# Patient Record
Sex: Female | Born: 1960 | Race: White | Hispanic: No | Marital: Married | Smoking: Never smoker
Health system: Southern US, Community
[De-identification: ages and names within clinical notes are randomized; demographics above are authoritative.]

## PROBLEM LIST (undated history)

## (undated) DIAGNOSIS — F32A Depression, unspecified: Secondary | ICD-10-CM

## (undated) DIAGNOSIS — M1990 Unspecified osteoarthritis, unspecified site: Secondary | ICD-10-CM

## (undated) DIAGNOSIS — Z889 Allergy status to unspecified drugs, medicaments and biological substances status: Secondary | ICD-10-CM

## (undated) DIAGNOSIS — K219 Gastro-esophageal reflux disease without esophagitis: Secondary | ICD-10-CM

## (undated) DIAGNOSIS — L309 Dermatitis, unspecified: Secondary | ICD-10-CM

## (undated) DIAGNOSIS — F329 Major depressive disorder, single episode, unspecified: Secondary | ICD-10-CM

## (undated) DIAGNOSIS — F419 Anxiety disorder, unspecified: Secondary | ICD-10-CM

## (undated) HISTORY — DX: Major depressive disorder, single episode, unspecified: F32.9

## (undated) HISTORY — DX: Depression, unspecified: F32.A

## (undated) HISTORY — PX: COLONOSCOPY: SHX174

## (undated) HISTORY — DX: Gastro-esophageal reflux disease without esophagitis: K21.9

## (undated) HISTORY — PX: REFRACTIVE SURGERY: SHX103

## (undated) HISTORY — DX: Anxiety disorder, unspecified: F41.9

## (undated) HISTORY — DX: Dermatitis, unspecified: L30.9

## (undated) HISTORY — PX: EYE SURGERY: SHX253

---

## 1994-03-18 HISTORY — PX: BREAST EXCISIONAL BIOPSY: SUR124

## 1994-03-18 HISTORY — PX: BREAST BIOPSY: SHX20

## 2015-10-17 ENCOUNTER — Ambulatory Visit: Payer: Self-pay | Admitting: Family Medicine

## 2015-11-16 ENCOUNTER — Ambulatory Visit (INDEPENDENT_AMBULATORY_CARE_PROVIDER_SITE_OTHER): Payer: Self-pay | Admitting: Family Medicine

## 2015-11-16 ENCOUNTER — Encounter: Payer: Self-pay | Admitting: Family Medicine

## 2015-11-16 VITALS — BP 124/84 | HR 87 | Temp 98.2°F | Resp 12 | Ht 68.0 in | Wt 190.8 lb

## 2015-11-16 DIAGNOSIS — L301 Dyshidrosis [pompholyx]: Secondary | ICD-10-CM

## 2015-11-16 DIAGNOSIS — K219 Gastro-esophageal reflux disease without esophagitis: Secondary | ICD-10-CM | POA: Insufficient documentation

## 2015-11-16 DIAGNOSIS — F325 Major depressive disorder, single episode, in full remission: Secondary | ICD-10-CM

## 2015-11-16 DIAGNOSIS — M79672 Pain in left foot: Secondary | ICD-10-CM

## 2015-11-16 DIAGNOSIS — M79671 Pain in right foot: Secondary | ICD-10-CM

## 2015-11-16 DIAGNOSIS — L738 Other specified follicular disorders: Secondary | ICD-10-CM

## 2015-11-16 DIAGNOSIS — F419 Anxiety disorder, unspecified: Secondary | ICD-10-CM

## 2015-11-16 MED ORDER — CLOBETASOL PROPIONATE 0.05 % EX CREA: 1.0000 "application " | TOPICAL_CREAM | Freq: Two times a day (BID) | CUTANEOUS | 0 refills | Status: DC

## 2015-11-16 MED ORDER — ALPRAZOLAM 0.5 MG PO TABS: ORAL_TABLET | ORAL | 0 refills | Status: DC

## 2015-11-16 MED ORDER — FLUOXETINE HCL 20 MG PO TABS: 20.0000 mg | ORAL_TABLET | Freq: Every day | ORAL | 3 refills | Status: DC

## 2015-11-16 MED ORDER — OMEPRAZOLE 20 MG PO CPDR: 20.0000 mg | DELAYED_RELEASE_CAPSULE | ORAL | 2 refills | Status: DC | PRN

## 2015-11-16 NOTE — Progress Notes (Signed)
HPI:   [name] is a 55 y.o. female, who is here today to establish care with me.  Former PCP: Dr Madeline Richmond in [place] Last preventive routine visit: 03/2015 she had her routine gyn preventive care, according to patient, she usually has cholesterol and diabetes screening done along with pap smear and mammogram. Colonoscopy reported at age 72.    Concerns today: Heel pain, hand rash, dermatologists referral,and medication refills.  -Foot pain: According to patient, for about 8-9 months she has had bilateral heel pain, she points at the posterior aspect of heels. Pain is mild, overall is stable. She has tried to change shoewear but it does not seem to help. Occasionally she takes OTC Advil. No local edema, erythema, or deformity. No history of trauma. Pain can be worse in the morning when she first gets up, it may get a "litle" better after a few steps but no always. It is also exacerbated by prolonged walking. This problem has been addressed previously by former PCP, she would like to see a foot doctor.  -She is also reporting history of eczema, but for the past few months she's been having pruritic hand rash, she has used prescription topical steroids  unsuccessfully, last one she used betamethasone cream. No new detergents, lotions, or medications. She also would like a referral to a dermatologist, she has noted some skin lesions on her face and neck that she would like removed. No prior history of skin cancer.  -Hx of depression, she has been on Prozac 20 mg for over 10 years. She still feels like medication is helping, she denies any depressed mood or suicidal thoughts. She denies any history of psychiatric hospitalizations. She is also requesting a prescription for Xanax 1 mg, she takes half tablet as needed when she is going to fly, which causes anxiety. She denies any side effects from medication.   GERD: She is currently on omeprazole 20 mg, which now she is not  taking daily. Certain food exacerbates symptoms.  Denies abdominal pain, nausea, vomiting, changes in bowel habits, blood in stool or melena.  She lives with her husband, her daughter comes from college during breaks. She follows a healthy diet and exercises regularly.   Review of Systems  Constitutional: Negative for activity change, appetite change, fatigue, fever and unexpected weight change.  HENT: Negative for mouth sores, nosebleeds and trouble swallowing.   Eyes: Negative for redness and visual disturbance.  Respiratory: Negative for cough, shortness of breath and wheezing.   Cardiovascular: Negative for chest pain, palpitations and leg swelling.  Gastrointestinal: Negative for abdominal pain, nausea and vomiting.       Negative for changes in bowel habits.  Genitourinary: Negative for decreased urine volume, difficulty urinating, dysuria and hematuria.  Musculoskeletal: Positive for arthralgias. Negative for gait problem, joint swelling and myalgias.  Skin: Positive for rash. Negative for wound.  Neurological: Negative for seizures, syncope, weakness, numbness and headaches.  Psychiatric/Behavioral: Negative for confusion and sleep disturbance. The patient is nervous/anxious.       No current outpatient prescriptions on file prior to visit.   No current facility-administered medications on file prior to visit.      Past Medical History:  Diagnosis Date  . Anxiety   . Depression   . Eczema   . GERD (gastroesophageal reflux disease)    Allergies  Allergen Reactions  . Codeine Camsylate [Codeine]     Family History  Problem Relation Age of Onset  . Arthritis Mother   .  Cancer Father   . Drug abuse Father   . Hypertension Father   . Cancer Sister     Social History   Social History  . Marital status: Married    Spouse name: N/A  . Number of children: N/A  . Years of education: N/A   Social History Main Topics  . Smoking status: Never Smoker  .  Smokeless tobacco: Never Used  . Alcohol use 4.2 oz/week    7 Glasses of wine per week  . Drug use: No  . Sexual activity: Yes    Birth control/ protection: IUD   Other Topics Concern  . None   Social History Narrative  . None    Vitals:   11/16/15 0900  BP: 124/84  Pulse: 87  Resp: 12  Temp: 98.2 F (36.8 C)   O2 sat 97% at RA.  Body mass index is 29.01 kg/m.      Physical Exam  Nursing note and vitals reviewed. Constitutional: She is oriented to person, place, and time. She appears well-developed. No distress.  HENT:  Head: Atraumatic.  Mouth/Throat: Oropharynx is clear and moist and mucous membranes are normal.  Eyes: Conjunctivae and EOM are normal. Pupils are equal, round, and reactive to light.  Neck: No thyroid mass and no thyromegaly present.  Cardiovascular: Normal rate and regular rhythm.   No murmur heard. Pulses:      Dorsalis pedis pulses are 2+ on the right side, and 2+ on the left side.  Respiratory: Effort normal and breath sounds normal. No respiratory distress.  GI: Soft. She exhibits no mass. There is no hepatomegaly. There is no tenderness.  Musculoskeletal: She exhibits no edema.       Right ankle: She exhibits normal range of motion.       Left ankle: She exhibits normal range of motion.       Right foot: There is normal range of motion and no deformity.       Left foot: There is normal range of motion and no deformity.       Feet:  Mild tenderness upon palpation of right posterior aspect of heel, Achilles tendon insertion. Normal ankle flexion and extension, it doesn't elicit pain. No signs of synovitis.  Lymphadenopathy:    She has no cervical adenopathy.  Neurological: She is alert and oriented to person, place, and time. She has normal strength. Coordination normal.  Skin: Skin is warm. Rash noted. Rash is maculopapular. No erythema.     Scattered papular (1-2 mm), whitish lesions on mandibular area (R) and upper neck. Fine/scaly,  erythematous rash noted on the palmar aspect right hand, some lesions also on medial and lateral aspect of IP joints and distal fingers.   Psychiatric: She has a normal mood and affect. Her speech is normal.  Well groomed, good eye contact.      ASSESSMENT AND PLAN:     Madeline Richmond was seen today for new patient (initial visit).  Diagnoses and all orders for this visit:  Gastroesophageal reflux disease without esophagitis  Stable. GERD precautions to continue. No changes in current management, some side effects of PPI discussed. F/U in 12 months.  -     omeprazole (PRILOSEC) 20 MG capsule; Take 1 capsule (20 mg total) by mouth as needed.  Heel pain, bilateral  ? Achilles tendinitis. Chronic, I explained that I'll treat refill is not necessary with most of her insurance, she will let me know if it is needed. OTC NSAIDs currently taking as  needed for pain.  Eczema, dyshidrotic  Topical steroid, Clobetasol, recommended. Recommend applying small amount twice per day as needed. Also recommended wearing gloves with cotton lining to prevent irritation.  -     clobetasol cream (TEMOVATE) 0.05 %; Apply 1 application topically 2 (two) times daily. 14 days  Depression, major, in remission ([place])  Stable. She is not interested in trying to wean off medication, so no changes in current management. F/U in 12 months, before if needed.  -     FLUoxetine (PROZAC) 20 MG tablet; Take 1 tablet (20 mg total) by mouth daily.  Anxiety disorder, unspecified  A Rx for Xanax 0.5 mg # 30/0 given today to take as needed. Explained that if she doesn't need a refill she can follow up annually.  -     ALPRAZolam (XANAX) 0.5 MG tablet; 1 tab as needed when flying  Sebaceous hyperplasia of face  Reassured. Referral to dermatologist was placed.   -     Ambulatory referral to Dermatology    -She will continue following with her gynecologist. -I will see you annually, before if  needed.        Betty G. [place], MD  [facility]. [place] office.

## 2015-11-16 NOTE — Progress Notes (Signed)
Pre visit review using our clinic review tool, if applicable. No additional management support is needed unless otherwise documented below in the visit note. 

## 2015-11-16 NOTE — Patient Instructions (Addendum)
A few things to remember from today's visit:   Gastroesophageal reflux disease without esophagitis  Heel pain, bilateral  Eczema, dyshidrotic  Depression, major, in remission ([place])  Anxiety disorder, unspecified  Sebaceous hyperplasia of face - Plan: Ambulatory referral to Dermatology  If a prescription of Xanax last a year ago had to follow until next office visit in a year.  Ms.Keelee Roll, today we have followed on some of your chronic medical problems and they seem to be stable, so no changes in current management today.  Review medication list and be sure if is accurate.  -Remember a healthy diet and regular physical activity are very important for prevention as well as for well being; they also help with many chronic problems, decreasing the need of adding new medications and delaying or preventing possible complications.  Remember to arrange your follow up appt before leaving today. Please follow sooner than planned if a new concern arises.  For podiatry, foot doctor, referral is not needed, please let me know if this is not your case.     Avoid foods that make your symptoms worse, for example coffee, chocolate,pepermeint,alcohol, and greasy food. Raising the head of your bed about 6 inches may help with nocturnal symptoms.   Weight loss (if you are overweight). Avoid lying down for 3 hours after eating.  Instead 3 large meals daily try small and more frequent meals during the day.  Some medications we recommend for acid reflux treatment (proton pump inhibitors) can cause some problems in the long term: increase risk of  vitamin deficiencies,pneumonia, and more recently discovered that it can increase the risk of chronic kidney disease and cardiovascular disease.

## 2015-11-21 ENCOUNTER — Other Ambulatory Visit: Payer: Self-pay

## 2015-11-21 DIAGNOSIS — K219 Gastro-esophageal reflux disease without esophagitis: Secondary | ICD-10-CM

## 2015-11-21 MED ORDER — OMEPRAZOLE 20 MG PO CPDR: DELAYED_RELEASE_CAPSULE | ORAL | 2 refills | Status: DC

## 2016-10-31 DIAGNOSIS — M79672 Pain in left foot: Secondary | ICD-10-CM | POA: Diagnosis not present

## 2016-10-31 DIAGNOSIS — M23352 Other meniscus derangements, posterior horn of lateral meniscus, left knee: Secondary | ICD-10-CM | POA: Diagnosis not present

## 2016-10-31 DIAGNOSIS — M25572 Pain in left ankle and joints of left foot: Secondary | ICD-10-CM | POA: Diagnosis not present

## 2016-10-31 DIAGNOSIS — M7671 Peroneal tendinitis, right leg: Secondary | ICD-10-CM | POA: Diagnosis not present

## 2016-10-31 DIAGNOSIS — G8929 Other chronic pain: Secondary | ICD-10-CM | POA: Diagnosis not present

## 2016-11-08 DIAGNOSIS — L820 Inflamed seborrheic keratosis: Secondary | ICD-10-CM | POA: Diagnosis not present

## 2016-11-08 DIAGNOSIS — C4441 Basal cell carcinoma of skin of scalp and neck: Secondary | ICD-10-CM | POA: Diagnosis not present

## 2016-11-08 DIAGNOSIS — L308 Other specified dermatitis: Secondary | ICD-10-CM | POA: Diagnosis not present

## 2016-11-08 DIAGNOSIS — D2262 Melanocytic nevi of left upper limb, including shoulder: Secondary | ICD-10-CM | POA: Diagnosis not present

## 2016-11-08 DIAGNOSIS — L821 Other seborrheic keratosis: Secondary | ICD-10-CM | POA: Diagnosis not present

## 2016-11-08 DIAGNOSIS — D2261 Melanocytic nevi of right upper limb, including shoulder: Secondary | ICD-10-CM | POA: Diagnosis not present

## 2016-11-08 DIAGNOSIS — C44519 Basal cell carcinoma of skin of other part of trunk: Secondary | ICD-10-CM | POA: Diagnosis not present

## 2016-11-08 DIAGNOSIS — C44619 Basal cell carcinoma of skin of left upper limb, including shoulder: Secondary | ICD-10-CM | POA: Diagnosis not present

## 2016-11-19 DIAGNOSIS — M23352 Other meniscus derangements, posterior horn of lateral meniscus, left knee: Secondary | ICD-10-CM | POA: Diagnosis not present

## 2016-11-20 DIAGNOSIS — M23352 Other meniscus derangements, posterior horn of lateral meniscus, left knee: Secondary | ICD-10-CM | POA: Diagnosis not present

## 2016-11-20 DIAGNOSIS — M25561 Pain in right knee: Secondary | ICD-10-CM | POA: Diagnosis not present

## 2016-11-20 DIAGNOSIS — M7671 Peroneal tendinitis, right leg: Secondary | ICD-10-CM | POA: Diagnosis not present

## 2016-11-22 DIAGNOSIS — M23352 Other meniscus derangements, posterior horn of lateral meniscus, left knee: Secondary | ICD-10-CM | POA: Diagnosis not present

## 2016-11-25 DIAGNOSIS — M23352 Other meniscus derangements, posterior horn of lateral meniscus, left knee: Secondary | ICD-10-CM | POA: Diagnosis not present

## 2016-11-30 ENCOUNTER — Other Ambulatory Visit: Payer: Self-pay | Admitting: Family Medicine

## 2016-11-30 DIAGNOSIS — F325 Major depressive disorder, single episode, in full remission: Secondary | ICD-10-CM

## 2017-01-07 DIAGNOSIS — M23352 Other meniscus derangements, posterior horn of lateral meniscus, left knee: Secondary | ICD-10-CM | POA: Diagnosis not present

## 2017-01-09 DIAGNOSIS — C44519 Basal cell carcinoma of skin of other part of trunk: Secondary | ICD-10-CM | POA: Diagnosis not present

## 2017-01-09 DIAGNOSIS — C44619 Basal cell carcinoma of skin of left upper limb, including shoulder: Secondary | ICD-10-CM | POA: Diagnosis not present

## 2017-01-09 DIAGNOSIS — L308 Other specified dermatitis: Secondary | ICD-10-CM | POA: Diagnosis not present

## 2017-01-09 DIAGNOSIS — C4441 Basal cell carcinoma of skin of scalp and neck: Secondary | ICD-10-CM | POA: Diagnosis not present

## 2017-01-10 DIAGNOSIS — R202 Paresthesia of skin: Secondary | ICD-10-CM | POA: Diagnosis not present

## 2017-01-10 DIAGNOSIS — M25561 Pain in right knee: Secondary | ICD-10-CM | POA: Diagnosis not present

## 2017-01-23 ENCOUNTER — Other Ambulatory Visit: Payer: Self-pay | Admitting: Family Medicine

## 2017-01-23 DIAGNOSIS — K219 Gastro-esophageal reflux disease without esophagitis: Secondary | ICD-10-CM

## 2017-01-23 DIAGNOSIS — F419 Anxiety disorder, unspecified: Secondary | ICD-10-CM

## 2017-01-27 DIAGNOSIS — R202 Paresthesia of skin: Secondary | ICD-10-CM | POA: Diagnosis not present

## 2017-02-03 DIAGNOSIS — R202 Paresthesia of skin: Secondary | ICD-10-CM | POA: Diagnosis not present

## 2017-02-03 DIAGNOSIS — G5602 Carpal tunnel syndrome, left upper limb: Secondary | ICD-10-CM | POA: Diagnosis not present

## 2017-02-03 DIAGNOSIS — M542 Cervicalgia: Secondary | ICD-10-CM | POA: Diagnosis not present

## 2017-02-13 DIAGNOSIS — M542 Cervicalgia: Secondary | ICD-10-CM | POA: Diagnosis not present

## 2017-02-19 DIAGNOSIS — M542 Cervicalgia: Secondary | ICD-10-CM | POA: Diagnosis not present

## 2017-02-26 DIAGNOSIS — M542 Cervicalgia: Secondary | ICD-10-CM | POA: Diagnosis not present

## 2017-03-04 DIAGNOSIS — M542 Cervicalgia: Secondary | ICD-10-CM | POA: Diagnosis not present

## 2017-03-05 DIAGNOSIS — M542 Cervicalgia: Secondary | ICD-10-CM | POA: Diagnosis not present

## 2017-03-22 DIAGNOSIS — M542 Cervicalgia: Secondary | ICD-10-CM | POA: Diagnosis not present

## 2017-03-26 DIAGNOSIS — M79642 Pain in left hand: Secondary | ICD-10-CM | POA: Diagnosis not present

## 2017-03-26 DIAGNOSIS — M542 Cervicalgia: Secondary | ICD-10-CM | POA: Diagnosis not present

## 2017-04-07 DIAGNOSIS — M5412 Radiculopathy, cervical region: Secondary | ICD-10-CM | POA: Diagnosis not present

## 2017-04-10 DIAGNOSIS — L738 Other specified follicular disorders: Secondary | ICD-10-CM | POA: Diagnosis not present

## 2017-04-10 DIAGNOSIS — L814 Other melanin hyperpigmentation: Secondary | ICD-10-CM | POA: Diagnosis not present

## 2017-04-10 DIAGNOSIS — D1801 Hemangioma of skin and subcutaneous tissue: Secondary | ICD-10-CM | POA: Diagnosis not present

## 2017-04-10 DIAGNOSIS — L910 Hypertrophic scar: Secondary | ICD-10-CM | POA: Diagnosis not present

## 2017-04-10 DIAGNOSIS — Z85828 Personal history of other malignant neoplasm of skin: Secondary | ICD-10-CM | POA: Diagnosis not present

## 2017-04-18 ENCOUNTER — Other Ambulatory Visit: Payer: Self-pay | Admitting: Family Medicine

## 2017-04-18 DIAGNOSIS — F325 Major depressive disorder, single episode, in full remission: Secondary | ICD-10-CM

## 2017-04-21 DIAGNOSIS — F324 Major depressive disorder, single episode, in partial remission: Secondary | ICD-10-CM | POA: Insufficient documentation

## 2017-04-21 NOTE — Progress Notes (Signed)
HPI:   Ms.Monte Roehr is a 57 y.o. female, who is here today for follow up.   She was last seen on 10/2015.  Depression and anxiety, which she has had for 10+ years. Currently she is on Prozac 20 mg daily and Xanax 0.5 mg as needed (just when flying). She has tried to discontinue Prozac in the past and has not tolerated.  She denies depressed mood or suicidal thoughts.   GERD: She is currently on Omeprazole 20 mg daily. Spicy food makes heartburn worse.   Denies abdominal pain, nausea, vomiting, changes in bowel habits, blood in stool or melena.   Last colonoscopy done at age 37 r 66 when she was living in [place].   She follows with gyn annually and she tells me that labs are done in her office,next appt next week.   Traveling to [place] next month, requesting a Rx for Ambien  To take while she is overseas. She has taking it before and has helped with jet lag.    Review of Systems  Constitutional: Negative for activity change, appetite change, fatigue, fever and unexpected weight change.  HENT: Negative for mouth sores, nosebleeds and trouble swallowing.   Eyes: Negative for redness and visual disturbance.  Respiratory: Negative for cough, shortness of breath and wheezing.   Cardiovascular: Negative for chest pain, palpitations and leg swelling.  Gastrointestinal: Negative for abdominal pain, nausea and vomiting.       Negative for changes in bowel habits.  Endocrine: Negative for cold intolerance and heat intolerance.  Genitourinary: Negative for decreased urine volume and hematuria.  Musculoskeletal: Negative for gait problem and myalgias.  Neurological: Negative for seizures, syncope, weakness, numbness and headaches.  Psychiatric/Behavioral: Negative for confusion and sleep disturbance. The patient is not nervous/anxious.     No current outpatient medications on file prior to visit.   No current facility-administered medications on file prior to  visit.      Past Medical History:  Diagnosis Date  . Anxiety   . Depression   . Eczema   . GERD (gastroesophageal reflux disease)    Allergies  Allergen Reactions  . Codeine Camsylate [Codeine]     Social History   Socioeconomic History  . Marital status: Married    Spouse name: None  . Number of children: None  . Years of education: None  . Highest education level: None  Social Needs  . Financial resource strain: None  . Food insecurity - worry: None  . Food insecurity - inability: None  . Transportation needs - medical: None  . Transportation needs - non-medical: None  Occupational History  . None  Tobacco Use  . Smoking status: Never Smoker  . Smokeless tobacco: Never Used  Substance and Sexual Activity  . Alcohol use: Yes    Alcohol/week: 4.2 oz    Types: 7 Glasses of wine per week  . Drug use: No  . Sexual activity: Yes    Birth control/protection: IUD  Other Topics Concern  . None  Social History Narrative  . None    Vitals:   04/22/17 0955  BP: 125/77  Pulse: 73  Resp: 12  Temp: 98.2 F (36.8 C)  SpO2: 98%   Body mass index is 30.49 kg/m.   Physical Exam  Nursing note and vitals reviewed. Constitutional: She is oriented to person, place, and time. She appears well-developed. No distress.  HENT:  Head: Normocephalic and atraumatic.  Mouth/Throat: Oropharynx is clear and moist and mucous  membranes are normal.  Eyes: Conjunctivae are normal. Pupils are equal, round, and reactive to light.  Cardiovascular: Normal rate and regular rhythm.  No murmur heard. Pulses:      Dorsalis pedis pulses are 2+ on the right side, and 2+ on the left side.  Respiratory: Effort normal and breath sounds normal. No respiratory distress.  GI: Soft. She exhibits no mass. There is no hepatomegaly. There is no tenderness.  Musculoskeletal: She exhibits no edema or tenderness.  Lymphadenopathy:    She has no cervical adenopathy.  Neurological: She is alert and  oriented to person, place, and time. She has normal strength. Gait normal.  Skin: Skin is warm. No erythema.  Psychiatric: She has a normal mood and affect.  Well groomed, good eye contact.      ASSESSMENT AND PLAN:   Ms. Yazlin Ekblad was seen today for follow-up.   GERD (gastroesophageal reflux disease) Stable. No changes in current management. GERD precautions discussed. F/U in 12 months.   Depression, major, in partial remission ([place]) Well controlled, no changes in current management. Since symptoms have ben well controlled for years I think it is appropriate to follow in 26 months,before if needed.    Anxiety with flying Continue Xanax as needed. Usually 30 tabs last a year,so annual follow up.   Sleep disorder, circadian, jet lag type  Ambien 5 mg Rx given to take when she travels to [place]. Side effects discussed, instructed not to take it with Alprazolam.  Colon cancer screening  Reporting colonoscopy 5-6 years ago. FIT recommended,she will mail cards back.   In regard to lipid and diabetes screening,she is having labs at her gyn's office.     -Ms. Raylei Losurdo was advised to return sooner than planned today if new concerns arise.       Canary Fister G. [place], MD  [facility]. [place] office.

## 2017-04-22 ENCOUNTER — Encounter: Payer: Self-pay | Admitting: Family Medicine

## 2017-04-22 ENCOUNTER — Ambulatory Visit: Payer: BLUE CROSS/BLUE SHIELD | Admitting: Family Medicine

## 2017-04-22 VITALS — BP 125/77 | HR 73 | Temp 98.2°F | Resp 12 | Ht 68.0 in | Wt 200.5 lb

## 2017-04-22 DIAGNOSIS — F324 Major depressive disorder, single episode, in partial remission: Secondary | ICD-10-CM

## 2017-04-22 DIAGNOSIS — G4725 Circadian rhythm sleep disorder, jet lag type: Secondary | ICD-10-CM | POA: Diagnosis not present

## 2017-04-22 DIAGNOSIS — K219 Gastro-esophageal reflux disease without esophagitis: Secondary | ICD-10-CM

## 2017-04-22 DIAGNOSIS — Z1211 Encounter for screening for malignant neoplasm of colon: Secondary | ICD-10-CM | POA: Diagnosis not present

## 2017-04-22 DIAGNOSIS — F40243 Fear of flying: Secondary | ICD-10-CM

## 2017-04-22 DIAGNOSIS — F325 Major depressive disorder, single episode, in full remission: Secondary | ICD-10-CM | POA: Diagnosis not present

## 2017-04-22 DIAGNOSIS — F32A Depression, unspecified: Secondary | ICD-10-CM | POA: Insufficient documentation

## 2017-04-22 MED ORDER — FLUOXETINE HCL 20 MG PO CAPS: 20.0000 mg | ORAL_CAPSULE | Freq: Every day | ORAL | 3 refills | Status: DC

## 2017-04-22 MED ORDER — OMEPRAZOLE 20 MG PO CPDR: DELAYED_RELEASE_CAPSULE | ORAL | 3 refills | Status: DC

## 2017-04-22 MED ORDER — ALPRAZOLAM 0.5 MG PO TABS: ORAL_TABLET | ORAL | 1 refills | Status: DC

## 2017-04-22 MED ORDER — ZOLPIDEM TARTRATE 5 MG PO TABS: 5.0000 mg | ORAL_TABLET | Freq: Every evening | ORAL | 0 refills | Status: DC | PRN

## 2017-04-22 NOTE — Patient Instructions (Signed)
A few things to remember from today's visit:   Gastroesophageal reflux disease without esophagitis - Plan: omeprazole (PRILOSEC) 20 MG capsule  Major depressive disorder with single episode, in partial remission ([place])  Anxiety disorder, unspecified - Plan: ALPRAZolam (XANAX) 0.5 MG tablet  Depression, major, in remission ([place]) - Plan: FLUoxetine (PROZAC) 20 MG capsule  Do not take Ambien and Alprazolam together.  No changes today. Please be sure medication list is accurate. If a new problem present, please set up appointment sooner than planned today.

## 2017-04-22 NOTE — Assessment & Plan Note (Signed)
Stable. No changes in current management. GERD precautions discussed. F/U in 12 months.

## 2017-04-22 NOTE — Assessment & Plan Note (Signed)
Well controlled, no changes in current management. Since symptoms have ben well controlled for years I think it is appropriate to follow in 45 months,before if needed.

## 2017-04-22 NOTE — Assessment & Plan Note (Signed)
Continue Xanax as needed. Usually 30 tabs last a year,so annual follow up.

## 2017-05-06 ENCOUNTER — Other Ambulatory Visit: Payer: Self-pay | Admitting: *Deleted

## 2017-05-06 ENCOUNTER — Telehealth: Payer: Self-pay | Admitting: Family Medicine

## 2017-05-06 ENCOUNTER — Other Ambulatory Visit: Payer: Self-pay | Admitting: Family Medicine

## 2017-05-06 DIAGNOSIS — G4725 Circadian rhythm sleep disorder, jet lag type: Secondary | ICD-10-CM

## 2017-05-06 DIAGNOSIS — F40243 Fear of flying: Secondary | ICD-10-CM

## 2017-05-06 DIAGNOSIS — D485 Neoplasm of uncertain behavior of skin: Secondary | ICD-10-CM | POA: Diagnosis not present

## 2017-05-06 DIAGNOSIS — D225 Melanocytic nevi of trunk: Secondary | ICD-10-CM | POA: Diagnosis not present

## 2017-05-06 DIAGNOSIS — C44519 Basal cell carcinoma of skin of other part of trunk: Secondary | ICD-10-CM | POA: Diagnosis not present

## 2017-05-06 MED ORDER — ALPRAZOLAM 0.5 MG PO TABS: ORAL_TABLET | ORAL | 1 refills | Status: DC

## 2017-05-06 MED ORDER — ZOLPIDEM TARTRATE 5 MG PO TABS: 5.0000 mg | ORAL_TABLET | Freq: Every evening | ORAL | 0 refills | Status: DC | PRN

## 2017-05-06 NOTE — Telephone Encounter (Signed)
Rx's sent to her pharmacy.  Thanks, BJ

## 2017-05-06 NOTE — Telephone Encounter (Signed)
Left message for patient to return call concerning Rx's.

## 2017-05-06 NOTE — Telephone Encounter (Signed)
Rx's for Ambien and Xanax were printed and given to her [I believe].  Thanks, BJ

## 2017-05-06 NOTE — Telephone Encounter (Signed)
Patient believes she threw those away by accident. Can this be sent to [facility]. Please advise

## 2017-05-06 NOTE — Telephone Encounter (Signed)
Copied from [place]. Topic: General - Other >> May 06, 2017 10:09 AM Lolita Rieger, RMA wrote: Reason for CRM: pt stated that her xanax and ambein were not at the [facility] [address] and would like for these to be resent

## 2017-05-16 DIAGNOSIS — M25562 Pain in left knee: Secondary | ICD-10-CM | POA: Diagnosis not present

## 2017-06-10 ENCOUNTER — Ambulatory Visit
Admission: RE | Admit: 2017-06-10 | Discharge: 2017-06-10 | Disposition: A | Discharge status: Home / Self Care | Payer: BLUE CROSS/BLUE SHIELD | Source: Ambulatory Visit | Attending: Obstetrics & Gynecology | Admitting: Obstetrics & Gynecology

## 2017-06-10 ENCOUNTER — Other Ambulatory Visit: Payer: Self-pay | Admitting: Obstetrics & Gynecology

## 2017-06-10 DIAGNOSIS — Z1239 Encounter for other screening for malignant neoplasm of breast: Secondary | ICD-10-CM

## 2017-06-10 DIAGNOSIS — R102 Pelvic and perineal pain: Secondary | ICD-10-CM | POA: Diagnosis not present

## 2017-06-10 DIAGNOSIS — Z01411 Encounter for gynecological examination (general) (routine) with abnormal findings: Secondary | ICD-10-CM | POA: Diagnosis not present

## 2017-06-10 DIAGNOSIS — Z30432 Encounter for removal of intrauterine contraceptive device: Secondary | ICD-10-CM | POA: Diagnosis not present

## 2017-06-10 DIAGNOSIS — N951 Menopausal and female climacteric states: Secondary | ICD-10-CM | POA: Diagnosis not present

## 2017-06-10 DIAGNOSIS — Z1231 Encounter for screening mammogram for malignant neoplasm of breast: Secondary | ICD-10-CM | POA: Diagnosis not present

## 2017-06-11 ENCOUNTER — Other Ambulatory Visit: Payer: Self-pay | Admitting: Obstetrics & Gynecology

## 2017-06-11 DIAGNOSIS — L308 Other specified dermatitis: Secondary | ICD-10-CM | POA: Diagnosis not present

## 2017-06-11 DIAGNOSIS — R921 Mammographic calcification found on diagnostic imaging of breast: Secondary | ICD-10-CM

## 2017-06-13 ENCOUNTER — Ambulatory Visit
Admission: RE | Admit: 2017-06-13 | Discharge: 2017-06-13 | Disposition: A | Discharge status: Home / Self Care | Payer: BLUE CROSS/BLUE SHIELD | Source: Ambulatory Visit | Attending: Obstetrics & Gynecology | Admitting: Obstetrics & Gynecology

## 2017-06-13 DIAGNOSIS — R921 Mammographic calcification found on diagnostic imaging of breast: Secondary | ICD-10-CM

## 2017-06-13 DIAGNOSIS — R221 Localized swelling, mass and lump, neck: Secondary | ICD-10-CM | POA: Diagnosis not present

## 2017-08-27 DIAGNOSIS — Z85828 Personal history of other malignant neoplasm of skin: Secondary | ICD-10-CM | POA: Diagnosis not present

## 2017-08-27 DIAGNOSIS — L308 Other specified dermatitis: Secondary | ICD-10-CM | POA: Diagnosis not present

## 2017-08-27 DIAGNOSIS — L821 Other seborrheic keratosis: Secondary | ICD-10-CM | POA: Diagnosis not present

## 2017-08-27 DIAGNOSIS — L814 Other melanin hyperpigmentation: Secondary | ICD-10-CM | POA: Diagnosis not present

## 2017-08-27 DIAGNOSIS — C44519 Basal cell carcinoma of skin of other part of trunk: Secondary | ICD-10-CM | POA: Diagnosis not present

## 2017-08-27 DIAGNOSIS — L910 Hypertrophic scar: Secondary | ICD-10-CM | POA: Diagnosis not present

## 2017-08-29 DIAGNOSIS — M170 Bilateral primary osteoarthritis of knee: Secondary | ICD-10-CM | POA: Diagnosis not present

## 2017-08-29 DIAGNOSIS — M1712 Unilateral primary osteoarthritis, left knee: Secondary | ICD-10-CM | POA: Diagnosis not present

## 2017-08-29 DIAGNOSIS — M1711 Unilateral primary osteoarthritis, right knee: Secondary | ICD-10-CM | POA: Diagnosis not present

## 2017-08-29 DIAGNOSIS — M25562 Pain in left knee: Secondary | ICD-10-CM | POA: Diagnosis not present

## 2017-09-04 DIAGNOSIS — C44519 Basal cell carcinoma of skin of other part of trunk: Secondary | ICD-10-CM | POA: Diagnosis not present

## 2017-09-08 DIAGNOSIS — M1712 Unilateral primary osteoarthritis, left knee: Secondary | ICD-10-CM | POA: Diagnosis not present

## 2017-09-16 DIAGNOSIS — M1712 Unilateral primary osteoarthritis, left knee: Secondary | ICD-10-CM | POA: Diagnosis not present

## 2017-09-23 DIAGNOSIS — M1712 Unilateral primary osteoarthritis, left knee: Secondary | ICD-10-CM | POA: Diagnosis not present

## 2017-10-01 DIAGNOSIS — M25562 Pain in left knee: Secondary | ICD-10-CM | POA: Diagnosis not present

## 2017-10-07 DIAGNOSIS — M25562 Pain in left knee: Secondary | ICD-10-CM | POA: Diagnosis not present

## 2017-10-07 DIAGNOSIS — M1712 Unilateral primary osteoarthritis, left knee: Secondary | ICD-10-CM | POA: Diagnosis not present

## 2017-10-15 DIAGNOSIS — M25562 Pain in left knee: Secondary | ICD-10-CM | POA: Diagnosis not present

## 2017-11-03 DIAGNOSIS — M25562 Pain in left knee: Secondary | ICD-10-CM | POA: Diagnosis not present

## 2017-11-07 DIAGNOSIS — D1801 Hemangioma of skin and subcutaneous tissue: Secondary | ICD-10-CM | POA: Diagnosis not present

## 2017-11-07 DIAGNOSIS — L905 Scar conditions and fibrosis of skin: Secondary | ICD-10-CM | POA: Diagnosis not present

## 2017-11-07 DIAGNOSIS — Z85828 Personal history of other malignant neoplasm of skin: Secondary | ICD-10-CM | POA: Diagnosis not present

## 2017-11-07 DIAGNOSIS — D485 Neoplasm of uncertain behavior of skin: Secondary | ICD-10-CM | POA: Diagnosis not present

## 2017-11-07 DIAGNOSIS — D225 Melanocytic nevi of trunk: Secondary | ICD-10-CM | POA: Diagnosis not present

## 2017-11-07 DIAGNOSIS — D2261 Melanocytic nevi of right upper limb, including shoulder: Secondary | ICD-10-CM | POA: Diagnosis not present

## 2017-11-13 DIAGNOSIS — M1712 Unilateral primary osteoarthritis, left knee: Secondary | ICD-10-CM | POA: Diagnosis not present

## 2017-12-09 ENCOUNTER — Telehealth: Payer: Self-pay | Admitting: Family Medicine

## 2017-12-09 ENCOUNTER — Other Ambulatory Visit: Payer: Self-pay | Admitting: Family Medicine

## 2017-12-09 DIAGNOSIS — G4725 Circadian rhythm sleep disorder, jet lag type: Secondary | ICD-10-CM

## 2017-12-09 DIAGNOSIS — F40243 Fear of flying: Secondary | ICD-10-CM

## 2017-12-09 NOTE — Telephone Encounter (Signed)
Contacted pt regarding requests for medication refills and medication for traveler's diarrhea; she would like to know what medication Dr [place] recommends; also instructed pt to contact pharmacy for refills on zolpidem, fluoxetine, and alprazolam; she verbalizes understanding; please call the pt with Dr Doug Sou recommendations; she can be contacted at [phone].

## 2017-12-09 NOTE — Telephone Encounter (Signed)
Message sent to Dr. Jordan for review and approval. 

## 2017-12-09 NOTE — Telephone Encounter (Signed)
Copied from [place] [id number]. Topic: Quick Communication - Rx Refill/Question >> Dec 09, 2017 12:48 PM Reyne Dumas L wrote: Medication: Request for medication for travelers diarrhea. Pt states she will be traveling 09/28-10/20/19 to [place], [place], and [place].  Pt wants to be able to have something on hand while on international travel. FLUoxetine (PROZAC) 20 MG capsule - States she will need this while away but the pharmacy will not fill in advance without doctors approval. zolpidem (AMBIEN) 5 MG tablet - states she only has 2 left and would like a full refill of these before international travel ALPRAZolam Duanne Moron) 0.5 MG tablet - states she thinks she has enough but isn't entirely sure  Has the patient contacted their pharmacy? No (Agent: If no, request that the patient contact the pharmacy for the refill.) (Agent: If yes, when and what did the pharmacy advise?)  Preferred Pharmacy (with phone number or street name): [facility] [address] [place] - [address] [phone] (Phone) [phone] (Fax)  Agent: Please be advised that RX refills may take up to 3 business days. We ask that you follow-up with your pharmacy.

## 2017-12-10 DIAGNOSIS — M25562 Pain in left knee: Secondary | ICD-10-CM | POA: Diagnosis not present

## 2017-12-10 NOTE — Telephone Encounter (Signed)
Left detailed message for patient to call office to schedule follow-up for medication refills.

## 2017-12-10 NOTE — Telephone Encounter (Signed)
When I saw her in 04/2017, she is stated that prescription for Xanax (#30/1) will last the whole year. Similar situation with Ambien, which she uses just when she travels. She needs a follow-up appointment.   Thanks, BJ

## 2017-12-11 DIAGNOSIS — Z780 Asymptomatic menopausal state: Secondary | ICD-10-CM | POA: Diagnosis not present

## 2017-12-11 DIAGNOSIS — R102 Pelvic and perineal pain: Secondary | ICD-10-CM | POA: Diagnosis not present

## 2018-01-07 DIAGNOSIS — Z0100 Encounter for examination of eyes and vision without abnormal findings: Secondary | ICD-10-CM | POA: Diagnosis not present

## 2018-03-02 DIAGNOSIS — M25562 Pain in left knee: Secondary | ICD-10-CM | POA: Diagnosis not present

## 2018-04-20 DIAGNOSIS — M25512 Pain in left shoulder: Secondary | ICD-10-CM | POA: Diagnosis not present

## 2018-04-24 ENCOUNTER — Other Ambulatory Visit: Payer: Self-pay | Admitting: Family Medicine

## 2018-04-24 DIAGNOSIS — F325 Major depressive disorder, single episode, in full remission: Secondary | ICD-10-CM

## 2018-04-29 DIAGNOSIS — Z131 Encounter for screening for diabetes mellitus: Secondary | ICD-10-CM | POA: Diagnosis not present

## 2018-04-29 DIAGNOSIS — F418 Other specified anxiety disorders: Secondary | ICD-10-CM | POA: Diagnosis not present

## 2018-04-29 DIAGNOSIS — Z13220 Encounter for screening for lipoid disorders: Secondary | ICD-10-CM | POA: Diagnosis not present

## 2018-04-29 DIAGNOSIS — Z0000 Encounter for general adult medical examination without abnormal findings: Secondary | ICD-10-CM | POA: Diagnosis not present

## 2018-04-29 DIAGNOSIS — F324 Major depressive disorder, single episode, in partial remission: Secondary | ICD-10-CM | POA: Diagnosis not present

## 2018-05-08 ENCOUNTER — Other Ambulatory Visit: Payer: Self-pay | Admitting: Obstetrics & Gynecology

## 2018-05-08 DIAGNOSIS — Z1231 Encounter for screening mammogram for malignant neoplasm of breast: Secondary | ICD-10-CM

## 2018-06-15 ENCOUNTER — Inpatient Hospital Stay: Admission: RE | Admit: 2018-06-15 | Payer: BLUE CROSS/BLUE SHIELD | Source: Ambulatory Visit

## 2018-07-13 ENCOUNTER — Ambulatory Visit: Payer: BLUE CROSS/BLUE SHIELD

## 2018-08-14 DIAGNOSIS — M25562 Pain in left knee: Secondary | ICD-10-CM | POA: Diagnosis not present

## 2018-09-03 ENCOUNTER — Ambulatory Visit
Admission: RE | Admit: 2018-09-03 | Discharge: 2018-09-03 | Disposition: A | Discharge status: Home / Self Care | Payer: BLUE CROSS/BLUE SHIELD | Source: Ambulatory Visit | Attending: Obstetrics & Gynecology | Admitting: Obstetrics & Gynecology

## 2018-09-03 ENCOUNTER — Other Ambulatory Visit: Payer: Self-pay

## 2018-09-03 DIAGNOSIS — Z1231 Encounter for screening mammogram for malignant neoplasm of breast: Secondary | ICD-10-CM | POA: Diagnosis not present

## 2018-10-12 DIAGNOSIS — Z01419 Encounter for gynecological examination (general) (routine) without abnormal findings: Secondary | ICD-10-CM | POA: Diagnosis not present

## 2018-11-10 ENCOUNTER — Other Ambulatory Visit: Payer: Self-pay | Admitting: Family Medicine

## 2018-11-10 DIAGNOSIS — K219 Gastro-esophageal reflux disease without esophagitis: Secondary | ICD-10-CM

## 2018-11-18 ENCOUNTER — Other Ambulatory Visit: Payer: Self-pay

## 2018-11-18 DIAGNOSIS — R6889 Other general symptoms and signs: Secondary | ICD-10-CM | POA: Diagnosis not present

## 2018-11-18 DIAGNOSIS — Z20822 Contact with and (suspected) exposure to covid-19: Secondary | ICD-10-CM

## 2018-11-19 LAB — NOVEL CORONAVIRUS, NAA: SARS-CoV-2, NAA: NOT DETECTED

## 2018-12-07 DIAGNOSIS — L308 Other specified dermatitis: Secondary | ICD-10-CM | POA: Diagnosis not present

## 2018-12-23 DIAGNOSIS — M25562 Pain in left knee: Secondary | ICD-10-CM | POA: Diagnosis not present

## 2018-12-23 DIAGNOSIS — M25572 Pain in left ankle and joints of left foot: Secondary | ICD-10-CM | POA: Diagnosis not present

## 2018-12-29 DIAGNOSIS — D2261 Melanocytic nevi of right upper limb, including shoulder: Secondary | ICD-10-CM | POA: Diagnosis not present

## 2018-12-29 DIAGNOSIS — L821 Other seborrheic keratosis: Secondary | ICD-10-CM | POA: Diagnosis not present

## 2018-12-29 DIAGNOSIS — Z85828 Personal history of other malignant neoplasm of skin: Secondary | ICD-10-CM | POA: Diagnosis not present

## 2018-12-29 DIAGNOSIS — D2262 Melanocytic nevi of left upper limb, including shoulder: Secondary | ICD-10-CM | POA: Diagnosis not present

## 2019-01-26 ENCOUNTER — Other Ambulatory Visit: Payer: Self-pay

## 2019-01-26 DIAGNOSIS — Z20822 Contact with and (suspected) exposure to covid-19: Secondary | ICD-10-CM

## 2019-01-26 DIAGNOSIS — K589 Irritable bowel syndrome without diarrhea: Secondary | ICD-10-CM | POA: Diagnosis not present

## 2019-01-27 ENCOUNTER — Encounter: Payer: Self-pay | Admitting: Family Medicine

## 2019-01-27 LAB — NOVEL CORONAVIRUS, NAA: SARS-CoV-2, NAA: NOT DETECTED

## 2019-02-01 DIAGNOSIS — K589 Irritable bowel syndrome without diarrhea: Secondary | ICD-10-CM | POA: Diagnosis not present

## 2019-02-04 DIAGNOSIS — Z0100 Encounter for examination of eyes and vision without abnormal findings: Secondary | ICD-10-CM | POA: Diagnosis not present

## 2019-02-05 DIAGNOSIS — Z03818 Encounter for observation for suspected exposure to other biological agents ruled out: Secondary | ICD-10-CM | POA: Diagnosis not present

## 2019-02-17 DIAGNOSIS — K219 Gastro-esophageal reflux disease without esophagitis: Secondary | ICD-10-CM | POA: Diagnosis not present

## 2019-02-17 DIAGNOSIS — R197 Diarrhea, unspecified: Secondary | ICD-10-CM | POA: Diagnosis not present

## 2019-02-17 DIAGNOSIS — K649 Unspecified hemorrhoids: Secondary | ICD-10-CM | POA: Diagnosis not present

## 2019-03-01 DIAGNOSIS — R197 Diarrhea, unspecified: Secondary | ICD-10-CM | POA: Diagnosis not present

## 2019-03-15 DIAGNOSIS — Z1159 Encounter for screening for other viral diseases: Secondary | ICD-10-CM | POA: Diagnosis not present

## 2019-03-17 DIAGNOSIS — K5730 Diverticulosis of large intestine without perforation or abscess without bleeding: Secondary | ICD-10-CM | POA: Diagnosis not present

## 2019-03-17 DIAGNOSIS — K52832 Lymphocytic colitis: Secondary | ICD-10-CM | POA: Diagnosis not present

## 2019-03-17 DIAGNOSIS — K635 Polyp of colon: Secondary | ICD-10-CM | POA: Diagnosis not present

## 2019-03-17 DIAGNOSIS — Z86010 Personal history of colonic polyps: Secondary | ICD-10-CM | POA: Diagnosis not present

## 2019-03-17 DIAGNOSIS — K648 Other hemorrhoids: Secondary | ICD-10-CM | POA: Diagnosis not present

## 2019-03-17 DIAGNOSIS — K6289 Other specified diseases of anus and rectum: Secondary | ICD-10-CM | POA: Diagnosis not present

## 2019-03-30 ENCOUNTER — Ambulatory Visit: Payer: BC Managed Care – PPO | Attending: Internal Medicine

## 2019-03-30 ENCOUNTER — Other Ambulatory Visit: Payer: BC Managed Care – PPO

## 2019-03-30 DIAGNOSIS — Z20822 Contact with and (suspected) exposure to covid-19: Secondary | ICD-10-CM | POA: Diagnosis not present

## 2019-04-01 LAB — NOVEL CORONAVIRUS, NAA: SARS-CoV-2, NAA: NOT DETECTED

## 2019-04-12 ENCOUNTER — Other Ambulatory Visit: Payer: BC Managed Care – PPO

## 2019-04-14 ENCOUNTER — Ambulatory Visit: Payer: BC Managed Care – PPO

## 2019-04-14 ENCOUNTER — Other Ambulatory Visit: Payer: BC Managed Care – PPO

## 2019-04-16 ENCOUNTER — Ambulatory Visit: Payer: BC Managed Care – PPO | Attending: Internal Medicine

## 2019-04-16 DIAGNOSIS — Z20822 Contact with and (suspected) exposure to covid-19: Secondary | ICD-10-CM

## 2019-04-17 LAB — NOVEL CORONAVIRUS, NAA: SARS-CoV-2, NAA: NOT DETECTED

## 2019-05-13 DIAGNOSIS — D485 Neoplasm of uncertain behavior of skin: Secondary | ICD-10-CM | POA: Diagnosis not present

## 2019-05-13 DIAGNOSIS — L308 Other specified dermatitis: Secondary | ICD-10-CM | POA: Diagnosis not present

## 2019-05-23 DIAGNOSIS — Z20828 Contact with and (suspected) exposure to other viral communicable diseases: Secondary | ICD-10-CM | POA: Diagnosis not present

## 2019-06-08 DIAGNOSIS — Z13220 Encounter for screening for lipoid disorders: Secondary | ICD-10-CM | POA: Diagnosis not present

## 2019-06-08 DIAGNOSIS — K219 Gastro-esophageal reflux disease without esophagitis: Secondary | ICD-10-CM | POA: Diagnosis not present

## 2019-06-08 DIAGNOSIS — Z131 Encounter for screening for diabetes mellitus: Secondary | ICD-10-CM | POA: Diagnosis not present

## 2019-06-08 DIAGNOSIS — Z0000 Encounter for general adult medical examination without abnormal findings: Secondary | ICD-10-CM | POA: Diagnosis not present

## 2019-06-08 DIAGNOSIS — Z1159 Encounter for screening for other viral diseases: Secondary | ICD-10-CM | POA: Diagnosis not present

## 2019-06-30 DIAGNOSIS — M1712 Unilateral primary osteoarthritis, left knee: Secondary | ICD-10-CM | POA: Diagnosis not present

## 2019-07-29 ENCOUNTER — Other Ambulatory Visit: Payer: Self-pay

## 2019-07-29 ENCOUNTER — Encounter (HOSPITAL_COMMUNITY): Payer: Self-pay | Admitting: Physician Assistant

## 2019-07-29 ENCOUNTER — Emergency Department (HOSPITAL_COMMUNITY): Payer: BC Managed Care – PPO

## 2019-07-29 ENCOUNTER — Emergency Department (HOSPITAL_COMMUNITY)
Admission: EM | Admit: 2019-07-29 | Discharge: 2019-07-29 | Disposition: A | Discharge status: Home / Self Care | Payer: BC Managed Care – PPO | Attending: Emergency Medicine | Admitting: Emergency Medicine

## 2019-07-29 DIAGNOSIS — D179 Benign lipomatous neoplasm, unspecified: Secondary | ICD-10-CM | POA: Diagnosis not present

## 2019-07-29 DIAGNOSIS — I700 Atherosclerosis of aorta: Secondary | ICD-10-CM

## 2019-07-29 DIAGNOSIS — R110 Nausea: Secondary | ICD-10-CM | POA: Insufficient documentation

## 2019-07-29 DIAGNOSIS — R197 Diarrhea, unspecified: Secondary | ICD-10-CM | POA: Diagnosis not present

## 2019-07-29 DIAGNOSIS — R911 Solitary pulmonary nodule: Secondary | ICD-10-CM | POA: Insufficient documentation

## 2019-07-29 DIAGNOSIS — R1031 Right lower quadrant pain: Secondary | ICD-10-CM | POA: Diagnosis not present

## 2019-07-29 DIAGNOSIS — K7689 Other specified diseases of liver: Secondary | ICD-10-CM | POA: Diagnosis not present

## 2019-07-29 DIAGNOSIS — Z79899 Other long term (current) drug therapy: Secondary | ICD-10-CM | POA: Diagnosis not present

## 2019-07-29 DIAGNOSIS — N3000 Acute cystitis without hematuria: Secondary | ICD-10-CM | POA: Diagnosis not present

## 2019-07-29 DIAGNOSIS — D3001 Benign neoplasm of right kidney: Secondary | ICD-10-CM | POA: Diagnosis not present

## 2019-07-29 DIAGNOSIS — R1011 Right upper quadrant pain: Secondary | ICD-10-CM | POA: Insufficient documentation

## 2019-07-29 DIAGNOSIS — R079 Chest pain, unspecified: Secondary | ICD-10-CM | POA: Diagnosis not present

## 2019-07-29 DIAGNOSIS — D3000 Benign neoplasm of unspecified kidney: Secondary | ICD-10-CM | POA: Diagnosis not present

## 2019-07-29 DIAGNOSIS — R188 Other ascites: Secondary | ICD-10-CM | POA: Diagnosis not present

## 2019-07-29 DIAGNOSIS — R109 Unspecified abdominal pain: Secondary | ICD-10-CM

## 2019-07-29 LAB — COMPREHENSIVE METABOLIC PANEL
ALT: 16 U/L (ref 0–44)
AST: 17 U/L (ref 15–41)
Albumin: 3.7 g/dL (ref 3.5–5.0)
Alkaline Phosphatase: 60 U/L (ref 38–126)
Anion gap: 10 (ref 5–15)
BUN: 17 mg/dL (ref 6–20)
CO2: 23 mmol/L (ref 22–32)
Calcium: 8.9 mg/dL (ref 8.9–10.3)
Chloride: 106 mmol/L (ref 98–111)
Creatinine, Ser: 0.6 mg/dL (ref 0.44–1.00)
GFR calc Af Amer: >60 mL/min (ref >60)
GFR calc non Af Amer: >60 mL/min (ref >60)
Glucose, Bld: 109 mg/dL — ABNORMAL HIGH (ref 70–99)
Potassium: 4 mmol/L (ref 3.5–5.1)
Sodium: 139 mmol/L (ref 135–145)
Total Bilirubin: 0.6 mg/dL (ref 0.3–1.2)
Total Protein: 6.2 g/dL — ABNORMAL LOW (ref 6.5–8.1)

## 2019-07-29 LAB — CBC WITH DIFFERENTIAL/PLATELET
Abs Immature Granulocytes: 0.02 10*3/uL (ref 0.00–0.07)
Basophils Absolute: 0.1 10*3/uL (ref 0.0–0.1)
Basophils Relative: 1 %
Eosinophils Absolute: 0.2 10*3/uL (ref 0.0–0.5)
Eosinophils Relative: 3 %
HCT: 37.5 % (ref 36.0–46.0)
Hemoglobin: 11.9 g/dL — ABNORMAL LOW (ref 12.0–15.0)
Immature Granulocytes: 0 %
Lymphocytes Relative: 43 %
Lymphs Abs: 2.7 10*3/uL (ref 0.7–4.0)
MCH: 29.2 pg (ref 26.0–34.0)
MCHC: 31.7 g/dL (ref 30.0–36.0)
MCV: 92.1 fL (ref 80.0–100.0)
Monocytes Absolute: 0.5 10*3/uL (ref 0.1–1.0)
Monocytes Relative: 8 %
Neutro Abs: 2.8 10*3/uL (ref 1.7–7.7)
Neutrophils Relative %: 45 %
Platelets: 260 10*3/uL (ref 150–400)
RBC: 4.07 MIL/uL (ref 3.87–5.11)
RDW: 12.4 % (ref 11.5–15.5)
WBC: 6.2 10*3/uL (ref 4.0–10.5)
nRBC: 0 % (ref 0.0–0.2)

## 2019-07-29 LAB — URINALYSIS, ROUTINE W REFLEX MICROSCOPIC
Bilirubin Urine: NEGATIVE
Glucose, UA: NEGATIVE mg/dL
Hgb urine dipstick: NEGATIVE
Ketones, ur: NEGATIVE mg/dL
Nitrite: NEGATIVE
Protein, ur: NEGATIVE mg/dL
Specific Gravity, Urine: 1.028 (ref 1.005–1.030)
pH: 5 (ref 5.0–8.0)

## 2019-07-29 LAB — TROPONIN I (HIGH SENSITIVITY)
Troponin I (High Sensitivity): 9 ng/L (ref <18)
Troponin I (High Sensitivity): 3 ng/L (ref <18)

## 2019-07-29 LAB — LIPASE, BLOOD: Lipase: 23 U/L (ref 11–51)

## 2019-07-29 MED ORDER — CEPHALEXIN 500 MG PO CAPS
500.0000 mg | ORAL_CAPSULE | Freq: Four times a day (QID) | ORAL | 0 refills | Status: AC
Start: 2019-07-29 — End: 2019-08-05

## 2019-07-29 MED ORDER — ONDANSETRON HCL 4 MG PO TABS
4.0000 mg | ORAL_TABLET | Freq: Four times a day (QID) | ORAL | 0 refills | Status: DC
Start: 2019-07-29 — End: 2021-09-24

## 2019-07-29 MED ORDER — ONDANSETRON HCL 4 MG/2ML IJ SOLN
4.0000 mg | Freq: Once | INTRAMUSCULAR | Status: AC
  Administered 2019-07-29: 4 mg via INTRAVENOUS
  Filled 2019-07-29: qty 2

## 2019-07-29 MED ORDER — FENTANYL CITRATE (PF) 100 MCG/2ML IJ SOLN
25.0000 ug | Freq: Once | INTRAMUSCULAR | Status: AC
  Administered 2019-07-29: 25 ug via INTRAVENOUS
  Filled 2019-07-29: qty 2

## 2019-07-29 MED ORDER — METOCLOPRAMIDE HCL 5 MG/ML IJ SOLN
10.0000 mg | Freq: Once | INTRAMUSCULAR | Status: AC
  Administered 2019-07-29: 10 mg via INTRAVENOUS
  Filled 2019-07-29: qty 2

## 2019-07-29 MED ORDER — SODIUM CHLORIDE 0.9 % IV BOLUS
500.0000 mL | Freq: Once | INTRAVENOUS | Status: AC
  Administered 2019-07-29: 500 mL via INTRAVENOUS

## 2019-07-29 MED ORDER — MORPHINE SULFATE (PF) 4 MG/ML IV SOLN
4.0000 mg | Freq: Once | INTRAVENOUS | Status: AC
  Administered 2019-07-29: 4 mg via INTRAVENOUS
  Filled 2019-07-29: qty 1

## 2019-07-29 MED ORDER — KETOROLAC TROMETHAMINE 30 MG/ML IJ SOLN
30.0000 mg | Freq: Once | INTRAMUSCULAR | Status: AC
  Administered 2019-07-29: 30 mg via INTRAVENOUS
  Filled 2019-07-29: qty 1

## 2019-07-29 MED ORDER — ALUM & MAG HYDROXIDE-SIMETH 200-200-20 MG/5ML PO SUSP
30.0000 mL | Freq: Once | ORAL | Status: AC
  Administered 2019-07-29: 30 mL via ORAL
  Filled 2019-07-29: qty 30

## 2019-07-29 MED ORDER — IOHEXOL 350 MG/ML SOLN
100.0000 mL | Freq: Once | INTRAVENOUS | Status: AC | PRN
  Administered 2019-07-29: 100 mL via INTRAVENOUS

## 2019-07-29 MED ORDER — HYDROCODONE-ACETAMINOPHEN 5-325 MG PO TABS: 1.0000 | ORAL_TABLET | Freq: Four times a day (QID) | ORAL | 0 refills | Status: DC | PRN

## 2019-07-29 MED ORDER — FENTANYL CITRATE (PF) 100 MCG/2ML IJ SOLN
50.0000 ug | Freq: Once | INTRAMUSCULAR | Status: AC
  Administered 2019-07-29: 50 ug via INTRAVENOUS
  Filled 2019-07-29: qty 2

## 2019-07-29 MED ORDER — OMEPRAZOLE 40 MG PO CPDR
40.0000 mg | DELAYED_RELEASE_CAPSULE | Freq: Every day | ORAL | 0 refills | Status: DC
Start: 2019-07-29 — End: 2023-04-29

## 2019-07-29 MED ORDER — LIDOCAINE 5 % EX PTCH
1.0000 | MEDICATED_PATCH | CUTANEOUS | Status: DC
  Administered 2019-07-29: 1 via TRANSDERMAL
  Filled 2019-07-29: qty 1

## 2019-07-29 MED ORDER — SODIUM CHLORIDE 0.9 % IV BOLUS
1000.0000 mL | Freq: Once | INTRAVENOUS | Status: AC
  Administered 2019-07-29: 1000 mL via INTRAVENOUS

## 2019-07-29 NOTE — ED Notes (Signed)
Patient given discharge instructions. Questions were answered. Patient verbalized understanding of discharge instructions and care at home.  

## 2019-07-29 NOTE — ED Notes (Signed)
Pt had emesis episode after walking to bathroom

## 2019-07-29 NOTE — ED Provider Notes (Signed)
Medical screening examination/treatment/procedure(s) were conducted as a shared visit with non-physician practitioner(s) and myself.  I personally evaluated the patient during the encounter.      Patient seen by me along with physician assistant.  Patient with right CVA pain started yesterday with nausea.  No vomiting until after receiving pain medication here.  No history of any similar findings in the past.  No history of any kidney stones.  Not made worse by movement.  No history of any strain or injury.  Past medical history significant for gastroesophageal reflux disease.  No anterior abdominal pain.  Labs here no leukocytosis hemoglobin at 11.9.  Electrolytes including liver function test and kidney function all normal.  Urinalysis with a little bit of white blood cells.  But not classic for urinary tract infection.  But will send urine culture.  Certainly not consistent with a significant infection like pyelonephritis to that would cause the right CVA pain.  CT scan without evidence of any kidney stones with little bit of fluid in the pelvis incidental finding of pulmonary nodule.  Nothing to explain the pain in the right flank or right CVA area.  Vital signs without any acute findings no tachycardia not febrile blood pressure good oxygen saturations 100%.  Patient was treated with a course of antibiotics in case urinary tract infection I do not feel that explains all her pain.  Pain seemed to me to be more musculoskeletal in nature.  Treat with pain medicine and have her follow-up with her primary care doctor.  Will probably need antinausea medicine as well.      Fredia Sorrow, MD 07/29/19 [phone]

## 2019-07-29 NOTE — ED Triage Notes (Signed)
Pt reports R sided flank pain and back pain since yesterday with nausea.

## 2019-07-29 NOTE — Discharge Instructions (Addendum)
You were given a prescription for antibiotics to treat a potential urinary tract infection. Please take the antibiotic prescription fully. A culture was sent of your urine today to determine if there is any bacterial growth. If the results of the culture are positive and you require an antibiotic or a change of your prescribed antibiotic you will be contacted by the hospital. If the results are negative you will not be contacted.  Prescription given for Norco. Take medication as directed and do not operate machinery, drive a car, or work while taking this medication as it can make you drowsy. This medication can also make you constipated so you may need to take an over the counter laxative such as miralax.  You were given a prescription for zofran to help with your nausea and were given pepcid to help treat a potential ulcer. Please take as directed.  Please follow up with your primary care provider within 5-7 days for re-evaluation of your symptoms.   Please return to the emergency department for any new or worsening symptoms.

## 2019-07-29 NOTE — ED Provider Notes (Signed)
[facility] EMERGENCY DEPARTMENT Provider Note   CSN: [id number] Arrival date & time: 07/29/19  [id number]     History Chief Complaint  Patient presents with  . Flank Pain    Madeline Richmond is a 59 y.o. female.  HPI   Patient is a 59 year old female with a history of anxiety/depression, eczema, GERD, who presents to the emergency department today for evaluation of right flank pain.  States her pain started suddenly yesterday and has gradually worsened since onset.  Pain is constant in nature and is severe.  States she cannot get comfortable.  She tried taking some ibuprofen and medication for arthritis but it did not improve her symptoms.  She has associated nausea but has not vomited.  Denies any constipation but has had some diarrhea.  Denies any fevers, dysuria, frequency, urgency or hematuria.  Denies any other associated symptoms including no chest pain, shortness of breath, or pleuritic pain.  Past Medical History:  Diagnosis Date  . Anxiety   . Depression   . Eczema   . GERD (gastroesophageal reflux disease)     Patient Active Problem List   Diagnosis Date Noted  . Anxiety with flying 04/22/2017  . 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 Eczema, dyshidrotic 11/16/2015  . GERD (gastroesophageal reflux disease) 11/16/2015    Past Surgical History:  Procedure Laterality Date  . BREAST BIOPSY Left 1996  . BREAST EXCISIONAL BIOPSY Left 1996   benign  . EYE SURGERY       OB History   No obstetric history on file.     Family History  Problem Relation Age of Onset  . Arthritis Mother   . Cancer Father   . Drug abuse Father   . Hypertension Father   . Cancer Sister   . Breast cancer Sister     Social History   Tobacco Use  . Smoking status: Never Smoker  . Smokeless tobacco: Never Used  Substance Use Topics  . Alcohol use: Yes    Alcohol/week: 7.0 standard drinks   Types: 7 Glasses of wine per week  . Drug use: No    Home Medications Prior to Admission medications   Medication Sig Start Date End Date Taking? Authorizing Provider  FLUoxetine (PROZAC) 20 MG capsule TAKE ONE CAPSULE BY MOUTH DAILY Patient taking differently: Take 20 mg by mouth daily.  04/24/18  Yes Martinique, Betty G, MD  cephALEXin (KEFLEX) 500 MG capsule Take 1 capsule (500 mg total) by mouth 4 (four) times daily for 7 days. 07/29/19 08/05/19  Gayle Collard S, PA-C  HYDROcodone-acetaminophen (NORCO/VICODIN) 5-325 MG tablet Take 1 tablet by mouth every 6 (six) hours as needed. 07/29/19   Tyner Codner S, PA-C  omeprazole (PRILOSEC) 40 MG capsule Take 1 capsule (40 mg total) by mouth daily for 14 days. 07/29/19 08/12/19  Ondre Salvetti S, PA-C  ondansetron (ZOFRAN) 4 MG tablet Take 1 tablet (4 mg total) by mouth every 6 (six) hours. 07/29/19   Kostas Marrow S, PA-C    Allergies    Codeine camsylate [codeine]  Review of Systems   Review of Systems  Constitutional: Negative for fever.  HENT: Negative for sore throat.   Eyes: Negative for visual disturbance.  Respiratory: Negative for cough and shortness of breath.   Cardiovascular: Negative for chest pain.  Gastrointestinal: Positive for diarrhea and nausea. Negative for abdominal pain, constipation and vomiting.  Genitourinary: Positive for flank pain.  Negative for dysuria, frequency, hematuria and urgency.  Musculoskeletal: Negative for myalgias.  Skin: Negative for rash.  Neurological: Negative for headaches.  All other systems reviewed and are negative.   Physical Exam Updated Vital Signs BP 125/67   Pulse 74   Temp 98.5 F (36.9 C) (Oral)   Resp 13   SpO2 100%   Physical Exam Vitals and nursing note reviewed.  Constitutional:      General: She is not in acute distress.    Appearance: She is well-developed.  HENT:     Head: Normocephalic and atraumatic.  Eyes:     Conjunctiva/sclera: Conjunctivae normal.    Cardiovascular:     Rate and Rhythm: Normal rate and regular rhythm.     Pulses: Normal pulses.     Heart sounds: Normal heart sounds. No murmur.  Pulmonary:     Effort: Pulmonary effort is normal. No respiratory distress.     Breath sounds: Normal breath sounds. No wheezing, rhonchi or rales.  Abdominal:     General: Bowel sounds are normal.     Palpations: Abdomen is soft.     Tenderness: There is no abdominal tenderness. There is right CVA tenderness (very mild). There is no left CVA tenderness, guarding or rebound.  Musculoskeletal:     Cervical back: Neck supple.  Skin:    General: Skin is warm and dry.  Neurological:     Mental Status: She is alert.     ED Results / Procedures / Treatments   Labs (all labs ordered are listed, but only abnormal results are displayed) Labs Reviewed  URINALYSIS, ROUTINE W REFLEX MICROSCOPIC - Abnormal; Notable for the following components:      Result Value   APPearance CLOUDY (*)    Leukocytes,Ua SMALL (*)    Bacteria, UA RARE (*)    All other components within normal limits  CBC WITH DIFFERENTIAL/PLATELET - Abnormal; Notable for the following components:   Hemoglobin 11.9 (*)    All other components within normal limits  COMPREHENSIVE METABOLIC PANEL - Abnormal; Notable for the following components:   Glucose, Bld 109 (*)    Total Protein 6.2 (*)    All other components within normal limits  URINE CULTURE  LIPASE, BLOOD  TROPONIN I (HIGH SENSITIVITY)  TROPONIN I (HIGH SENSITIVITY)    EKG EKG Interpretation  Date/Time:  Thursday Jul 29 2019 10:37:12 EDT Ventricular Rate:  58 PR Interval:    QRS Duration: 106 QT Interval:  466 QTC Calculation: 458 R Axis:   27 Text Interpretation: Sinus rhythm No previous ECGs available Confirmed by Fredia Sorrow [phone]) on 07/29/2019 10:41:45 AM   Radiology CT Angio Chest PE W and/or Wo Contrast  Result Date: 07/29/2019 CLINICAL DATA:  Chest pain EXAM: CT ANGIOGRAPHY CHEST WITH  CONTRAST TECHNIQUE: Multidetector CT imaging of the chest was performed using the standard protocol during bolus administration of intravenous contrast. Multiplanar CT image reconstructions and MIPs were obtained to evaluate the vascular anatomy. CONTRAST:  80 mL OMNIPAQUE IOHEXOL 350 MG/ML SOLN COMPARISON:  None. FINDINGS: Cardiovascular: Satisfactory opacification of the pulmonary arteries to the segmental level. No evidence of pulmonary embolism. Normal heart size. No pericardial effusion. Mediastinum/Nodes: Subcentimeter right thyroid nodule. No further follow-up is recommended. There are no enlarged lymph nodes identified. Small hiatal hernia. Lungs/Pleura: Mild lower lobe mucous plugging bilaterally. Mild bibasilar atelectasis. No pleural effusion or pneumothorax. Upper Abdomen: Better evaluated on same day CT abdomen Musculoskeletal: Unremarkable. Review of the MIP images confirms the above findings. IMPRESSION:  No evidence of acute pulmonary embolism or other acute abnormality. Electronically Signed   By: Macy Mis M.D.   On: 07/29/2019 14:44   CT Renal Stone Study  Result Date: 07/29/2019 CLINICAL DATA:  59 year old female with history of right-sided flank pain and back pain since yesterday with some associated nausea. EXAM: CT ABDOMEN AND PELVIS WITHOUT CONTRAST TECHNIQUE: Multidetector CT imaging of the abdomen and pelvis was performed following the standard protocol without IV contrast. COMPARISON:  No priors. FINDINGS: Lower chest: 2 mm subpleural nodule in the anterior aspect of the right lower lobe abutting the major fissure (axial image 7 of series 5), nonspecific, but statistically likely benign. Small hiatal hernia. Hepatobiliary: Multiple low-attenuation lesions are noted in the liver, largest of which is exophytic extending off the inferior aspect of segment 3 of the liver (axial image 20 of series 3) measuring 5.6 x 4.9 cm. These are incompletely characterized on today's non-contrast CT  examination, but statistically likely to represent cysts. Gallbladder is normal in appearance. Pancreas: No definite pancreatic mass or peripancreatic fluid collections or inflammatory changes are noted on today's noncontrast CT examination. Spleen: Unremarkable. Adrenals/Urinary Tract: There are no abnormal calcifications within the collecting system of either kidney, along the course of either ureter, or within the lumen of the urinary bladder. No hydroureteronephrosis or perinephric stranding to suggest urinary tract obstruction at this time. Unenhanced appearance of the left kidney is normal. In the anterior aspect of the interpolar region of the right kidney there is a 1.8 x 1.8 cm fatty attenuation lesion (axial image 33 of series 3) which is compatible with an angiomyolipoma. Unenhanced appearance of the urinary bladder is normal. Bilateral adrenal glands are normal in appearance. Stomach/Bowel: Unenhanced appearance of the stomach is normal. There is no pathologic dilatation of small bowel or colon. The appendix is not confidently identified and may be surgically absent. Regardless, there are no inflammatory changes noted adjacent to the cecum to suggest the presence of an acute appendicitis at this time. Vascular/Lymphatic: Aortic atherosclerosis. No lymphadenopathy noted in the abdomen or pelvis. Reproductive: Uterus and ovaries are unremarkable in appearance. Other: Trace volume of ascites in the low anatomic pelvis. No pneumoperitoneum. Musculoskeletal: There are no aggressive appearing lytic or blastic lesions noted in the visualized portions of the skeleton. IMPRESSION: 1. Small volume of ascites in the low anatomic pelvis. 2. No other acute findings are noted to account for the patient's symptoms. Specifically, no urinary tract calculi no findings of urinary tract obstruction are noted at this time. 3. 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 4. Aortic  atherosclerosis. 5. 2 mm subpleural nodule in the anterior aspect of the right lower lobe abutting the major fissure. This is nonspecific, but statistically likely a benign subpleural lymph node. No follow-up needed if patient is low-risk. Non-contrast chest CT can be considered in 12 months if patient is high-risk. This recommendation follows the consensus statement: Guidelines for Management of Incidental Pulmonary Nodules Detected on CT Images: From the Fleischner Society 2017; Radiology 2017; 284:228-243. Electronically Signed   By: Vinnie Langton M.D.   On: 07/29/2019 08:27   US Abdomen Limited RUQ  Result Date: 07/29/2019 CLINICAL DATA:  Right upper quadrant pain EXAM: ULTRASOUND ABDOMEN LIMITED RIGHT UPPER QUADRANT COMPARISON:  None. FINDINGS: Gallbladder: No gallstones or wall thickening visualized. No sonographic Murphy sign noted by sonographer. Common bile duct: Diameter: 6 mm, within normal limits Liver: Three cysts are identified. Within normal limits in parenchymal echogenicity. Portal  vein is patent on color Doppler imaging with normal direction of blood flow towards the liver. Other: None. IMPRESSION: No significant abnormality. Electronically Signed   By: Macy Mis M.D.   On: 07/29/2019 14:50    Procedures Procedures (including critical care time)  Medications Ordered in ED Medications  lidocaine (LIDODERM) 5 % 1 patch (1 patch Transdermal Patch Applied 07/29/19 1014)  ketorolac (TORADOL) 30 MG/ML injection 30 mg (30 mg Intravenous Given 07/29/19 0745)  ondansetron (ZOFRAN) injection 4 mg (4 mg Intravenous Given 07/29/19 0745)  sodium chloride 0.9 % bolus 500 mL (0 mLs Intravenous Stopped 07/29/19 0921)  fentaNYL (SUBLIMAZE) injection 50 mcg (50 mcg Intravenous Given 07/29/19 0847)  sodium chloride 0.9 % bolus 500 mL (0 mLs Intravenous Stopped 07/29/19 1156)  ondansetron (ZOFRAN) injection 4 mg (4 mg Intravenous Given 07/29/19 0957)  fentaNYL (SUBLIMAZE) injection 25 mcg (25 mcg  Intravenous Given 07/29/19 1013)  alum & mag hydroxide-simeth (MAALOX/MYLANTA) 200-200-20 MG/5ML suspension 30 mL (30 mLs Oral Given 07/29/19 1045)  sodium chloride 0.9 % bolus 1,000 mL (0 mLs Intravenous Stopped 07/29/19 1528)  metoCLOPramide (REGLAN) injection 10 mg (10 mg Intravenous Given 07/29/19 1304)  morphine 4 MG/ML injection 4 mg (4 mg Intravenous Given 07/29/19 1305)  iohexol (OMNIPAQUE) 350 MG/ML injection 100 mL (100 mLs Intravenous Contrast Given 07/29/19 1405)    ED Course  I have reviewed the triage vital signs and the nursing notes.  Pertinent labs & imaging results that were available during my care of the patient were reviewed by me and considered in my medical decision making (see chart for details).    MDM Rules/Calculators/A&P                      Patient is a 59 year old female presenting with right flank pain that started yesterday.  Has had associated nausea and diarrhea but denies other associated symptoms.  On arrival patient is afebrile.  Mildly hypertensive but otherwise reassuring vitals.  She has some right CVA tenderness on exam but her abdomen is otherwise soft and nontender.  Will initiate work-up with labs, CT renal scan to rule out nephrolithiasis.  Will give IV fluids, Zofran and Toradol.  Reviewed/interpreted labs CBC w/o leukocytosis, mild anemia present CMP with normal electrolytes, kidney, and liver function UA with small leukocytes, 0-5 RBCs, 11-20 WBCs and rare bacteria. Urine culture.  CT renal with small volume ascites which is likely not clinically significant. Also with small renal angiomyolipoma, aortic atherosclerosis and a small subpleural lung nodule. There were not acute findings to account for patients pain and specifically there were no ureteral stones.   Reassessed pt. Discussed results of w/u including possible UTI and plan to tx with keflex. She is c/o continued pain. Will order fentanyl and reassess.   Reassessed pt. She states that  she has not had improvement of pain after fentanyl and is now having some lightheadedness. Her BP has dropped some since receiving fentanyl. Will order additional fluid bolus, a smaller dose of fentanyl after bp improves and give lidoderm patch for pain.   10:15 AM Nursing updated and states that pain is now stating that her pain is radiating to the ruq, epigastrium and lower chest. States it feels like there is air in her stomach. Rechecked pt. She has some epigastric ttp on exam, no ruq ttp or guarding noted. Will add lipase, trop, ekg, cxr. Will give gi cocktail. Dr. Rogene Houston recommends CTA chest.   12:55 PM rechecked pt. Still awaiting cta  chest. She continues to c/o epigastric and ruq abd pain. She has some ttp on exam but no rebound or guarding.   Lipase is negative.  Delta troponings are neg.  RUQ US shows normal gallbladder, liver cysts again identified CTA chest is w/o evidence of PE or other emergent abnormality.   3:00 PM reassessed pt. Husband is at bedside. I had a long discussion with them about the results and plan. Thus far we have not identified any emergency cause of the patient's symptoms but she is still not feeling back to baseline. She is still c/o pain and states she feels no better. She states symptoms feel like severe menstrual cramp in her back. I offered to complete pelvic exam to further eval for reproductive cause but she declined. I also offered her admission for pain control and observation but she declined. She states she would rather be discharged with pain medications. Discussed that pain meds may make her nauseated and I would give rx for zofran as well and she is comfortable with this. On prior reassessment we discussed possibility of UTI and plan for d/c with abx.  Dr. Rogene Houston also discussed the possibility of a duodenal ulcer causing sxs and offered to give rx for prilosec to tx this. All results were printed an given to the patient.   Advised she will need to  f/u with pcp in regards to findings today and for reassessment. Advised on close monitoring and return precautions. She voices understanding of the plan and reasons to return. All questions answered, pt stable for discharge.   -----  Pt was evaluated and treated in conjunction with supervising physician, Dr. Rogene Houston, who personally evaluated the patient and is in agreement with this plan.   Final Clinical Impression(s) / ED Diagnoses Final diagnoses:  Acute cystitis without hematuria  Angiomyolipoma  Aortic atherosclerosis ([facility])  Lung nodule  Flank pain  RUQ pain  Liver cyst    Rx / DC Orders ED Discharge Orders         Ordered    cephALEXin (KEFLEX) 500 MG capsule  4 times daily     07/29/19 1547    HYDROcodone-acetaminophen (NORCO/VICODIN) 5-325 MG tablet  Every 6 hours PRN     07/29/19 1547    ondansetron (ZOFRAN) 4 MG tablet  Every 6 hours     07/29/19 1547    omeprazole (PRILOSEC) 40 MG capsule  Daily     07/29/19 1547           Nikkita Adeyemi S, PA-C 07/29/19 [place]    Fredia Sorrow, MD 07/30/19 [phone]

## 2019-07-29 NOTE — ED Provider Notes (Signed)
Addendum to my previous note.  Patient's pain started to move around to the right upper quadrant area so we extended work-up.  EKG was done without acute findings.  Troponins x2 without any significant changes.  First 1 was 9-second 1 was 3.  Ultrasound was done of the right upper quadrant gallbladder completely normal not.  CT renal earlier without any acute findings.  And CT angio chest was done and that was negative for pulmonary embolus or for any acute findings.  Patient does have pain in the CVA right upper quadrant area.  Will need follow-up with primary care provider.  In addition since the pain was right CVA area moved around to the right upper quadrant the only thing that based on the studies we done that we would not be able to prove would be a duodenal ulcer.  Will discuss with patient whether she would like to go on Prilosec for 2 weeks to see if it makes any difference.  Otherwise we plan antibiotic for urinary tract infection which is probably not explaining the symptoms.  And they could follow-up on my chart if the culture is negative they can stop the antibiotic.  And something for pain and something for nausea.  It is important patient follow-up with primary care provider and return for any new or worse symptoms.  Patient nontoxic no acute distress here.   Fredia Sorrow, MD 07/29/19 1534

## 2019-07-30 LAB — URINE CULTURE

## 2019-09-21 DIAGNOSIS — L2089 Other atopic dermatitis: Secondary | ICD-10-CM | POA: Diagnosis not present

## 2019-09-21 DIAGNOSIS — L281 Prurigo nodularis: Secondary | ICD-10-CM | POA: Diagnosis not present

## 2019-09-21 DIAGNOSIS — L011 Impetiginization of other dermatoses: Secondary | ICD-10-CM | POA: Diagnosis not present

## 2019-09-29 DIAGNOSIS — M25562 Pain in left knee: Secondary | ICD-10-CM | POA: Diagnosis not present

## 2019-09-29 DIAGNOSIS — M25571 Pain in right ankle and joints of right foot: Secondary | ICD-10-CM | POA: Diagnosis not present

## 2019-09-29 DIAGNOSIS — M25572 Pain in left ankle and joints of left foot: Secondary | ICD-10-CM | POA: Diagnosis not present

## 2019-09-29 DIAGNOSIS — M25561 Pain in right knee: Secondary | ICD-10-CM | POA: Diagnosis not present

## 2019-10-14 DIAGNOSIS — Z01419 Encounter for gynecological examination (general) (routine) without abnormal findings: Secondary | ICD-10-CM | POA: Diagnosis not present

## 2019-10-15 ENCOUNTER — Other Ambulatory Visit: Payer: Self-pay | Admitting: Obstetrics & Gynecology

## 2019-10-15 DIAGNOSIS — Z1231 Encounter for screening mammogram for malignant neoplasm of breast: Secondary | ICD-10-CM

## 2019-10-26 ENCOUNTER — Other Ambulatory Visit: Payer: Self-pay

## 2019-10-26 ENCOUNTER — Ambulatory Visit
Admission: RE | Admit: 2019-10-26 | Discharge: 2019-10-26 | Disposition: A | Discharge status: Home / Self Care | Payer: BC Managed Care – PPO | Source: Ambulatory Visit | Attending: Obstetrics & Gynecology | Admitting: Obstetrics & Gynecology

## 2019-10-26 DIAGNOSIS — Z1231 Encounter for screening mammogram for malignant neoplasm of breast: Secondary | ICD-10-CM | POA: Diagnosis not present

## 2019-10-27 ENCOUNTER — Other Ambulatory Visit: Payer: Self-pay | Admitting: Obstetrics & Gynecology

## 2019-10-27 DIAGNOSIS — R928 Other abnormal and inconclusive findings on diagnostic imaging of breast: Secondary | ICD-10-CM

## 2019-11-10 ENCOUNTER — Ambulatory Visit
Admission: RE | Admit: 2019-11-10 | Discharge: 2019-11-10 | Disposition: A | Discharge status: Home / Self Care | Payer: BC Managed Care – PPO | Source: Ambulatory Visit | Attending: Obstetrics & Gynecology | Admitting: Obstetrics & Gynecology

## 2019-11-10 ENCOUNTER — Other Ambulatory Visit: Payer: Self-pay

## 2019-11-10 ENCOUNTER — Other Ambulatory Visit: Payer: Self-pay | Admitting: Obstetrics & Gynecology

## 2019-11-10 DIAGNOSIS — R928 Other abnormal and inconclusive findings on diagnostic imaging of breast: Secondary | ICD-10-CM

## 2019-11-10 DIAGNOSIS — N63 Unspecified lump in unspecified breast: Secondary | ICD-10-CM

## 2019-11-10 DIAGNOSIS — N6313 Unspecified lump in the right breast, lower outer quadrant: Secondary | ICD-10-CM | POA: Diagnosis not present

## 2019-11-11 DIAGNOSIS — L2089 Other atopic dermatitis: Secondary | ICD-10-CM | POA: Diagnosis not present

## 2020-01-03 DIAGNOSIS — L821 Other seborrheic keratosis: Secondary | ICD-10-CM | POA: Diagnosis not present

## 2020-01-03 DIAGNOSIS — L570 Actinic keratosis: Secondary | ICD-10-CM | POA: Diagnosis not present

## 2020-01-03 DIAGNOSIS — Z85828 Personal history of other malignant neoplasm of skin: Secondary | ICD-10-CM | POA: Diagnosis not present

## 2020-01-03 DIAGNOSIS — D2361 Other benign neoplasm of skin of right upper limb, including shoulder: Secondary | ICD-10-CM | POA: Diagnosis not present

## 2020-01-03 DIAGNOSIS — C44519 Basal cell carcinoma of skin of other part of trunk: Secondary | ICD-10-CM | POA: Diagnosis not present

## 2020-02-16 DIAGNOSIS — M25562 Pain in left knee: Secondary | ICD-10-CM | POA: Diagnosis not present

## 2020-05-15 ENCOUNTER — Other Ambulatory Visit: Payer: BC Managed Care – PPO

## 2020-06-09 ENCOUNTER — Ambulatory Visit
Admission: RE | Admit: 2020-06-09 | Discharge: 2020-06-09 | Disposition: A | Discharge status: Home / Self Care | Payer: 59 | Source: Ambulatory Visit | Attending: Obstetrics & Gynecology | Admitting: Obstetrics & Gynecology

## 2020-06-09 ENCOUNTER — Ambulatory Visit
Admission: RE | Admit: 2020-06-09 | Discharge: 2020-06-09 | Disposition: A | Discharge status: Home / Self Care | Payer: BC Managed Care – PPO | Source: Ambulatory Visit | Attending: Obstetrics & Gynecology | Admitting: Obstetrics & Gynecology

## 2020-06-09 ENCOUNTER — Other Ambulatory Visit: Payer: Self-pay

## 2020-06-09 ENCOUNTER — Other Ambulatory Visit: Payer: Self-pay | Admitting: Obstetrics & Gynecology

## 2020-06-09 DIAGNOSIS — N63 Unspecified lump in unspecified breast: Secondary | ICD-10-CM

## 2020-06-21 ENCOUNTER — Other Ambulatory Visit: Payer: Self-pay | Admitting: Family Medicine

## 2020-06-21 DIAGNOSIS — E2839 Other primary ovarian failure: Secondary | ICD-10-CM

## 2020-08-02 ENCOUNTER — Other Ambulatory Visit: Payer: Self-pay | Admitting: Family Medicine

## 2020-08-02 DIAGNOSIS — E2839 Other primary ovarian failure: Secondary | ICD-10-CM

## 2020-11-28 ENCOUNTER — Ambulatory Visit
Admission: RE | Admit: 2020-11-28 | Discharge: 2020-11-28 | Disposition: A | Discharge status: Home / Self Care | Payer: 59 | Source: Ambulatory Visit | Attending: Obstetrics & Gynecology | Admitting: Obstetrics & Gynecology

## 2020-11-28 ENCOUNTER — Other Ambulatory Visit: Payer: Self-pay

## 2020-11-28 DIAGNOSIS — N63 Unspecified lump in unspecified breast: Secondary | ICD-10-CM

## 2021-01-10 ENCOUNTER — Other Ambulatory Visit: Payer: Self-pay

## 2021-01-10 ENCOUNTER — Ambulatory Visit
Admission: RE | Admit: 2021-01-10 | Discharge: 2021-01-10 | Disposition: A | Discharge status: Home / Self Care | Payer: 59 | Source: Ambulatory Visit | Attending: Family Medicine | Admitting: Family Medicine

## 2021-01-10 DIAGNOSIS — E2839 Other primary ovarian failure: Secondary | ICD-10-CM

## 2021-03-20 ENCOUNTER — Other Ambulatory Visit: Payer: Self-pay | Admitting: Family Medicine

## 2021-03-20 ENCOUNTER — Ambulatory Visit
Admission: RE | Admit: 2021-03-20 | Discharge: 2021-03-20 | Disposition: A | Discharge status: Home / Self Care | Payer: 59 | Source: Ambulatory Visit | Attending: Family Medicine | Admitting: Family Medicine

## 2021-03-20 DIAGNOSIS — S20211A Contusion of right front wall of thorax, initial encounter: Secondary | ICD-10-CM

## 2021-09-07 ENCOUNTER — Other Ambulatory Visit: Payer: Self-pay | Admitting: *Deleted

## 2021-09-07 DIAGNOSIS — I8393 Asymptomatic varicose veins of bilateral lower extremities: Secondary | ICD-10-CM

## 2021-09-24 ENCOUNTER — Encounter: Payer: Self-pay | Admitting: Physician Assistant

## 2021-09-24 ENCOUNTER — Ambulatory Visit (HOSPITAL_COMMUNITY)
Admission: RE | Admit: 2021-09-24 | Discharge: 2021-09-24 | Disposition: A | Discharge status: Home / Self Care | Payer: 59 | Source: Ambulatory Visit | Attending: Surgery | Admitting: Surgery

## 2021-09-24 ENCOUNTER — Ambulatory Visit: Payer: 59 | Admitting: Physician Assistant

## 2021-09-24 VITALS — BP 125/75 | HR 61 | Temp 98.1°F | Resp 20 | Ht 68.0 in | Wt 188.4 lb

## 2021-09-24 DIAGNOSIS — R197 Diarrhea, unspecified: Secondary | ICD-10-CM | POA: Insufficient documentation

## 2021-09-24 DIAGNOSIS — K649 Unspecified hemorrhoids: Secondary | ICD-10-CM | POA: Insufficient documentation

## 2021-09-24 DIAGNOSIS — I8393 Asymptomatic varicose veins of bilateral lower extremities: Secondary | ICD-10-CM | POA: Insufficient documentation

## 2021-09-24 DIAGNOSIS — J309 Allergic rhinitis, unspecified: Secondary | ICD-10-CM | POA: Insufficient documentation

## 2021-09-24 DIAGNOSIS — M25569 Pain in unspecified knee: Secondary | ICD-10-CM | POA: Insufficient documentation

## 2021-09-24 DIAGNOSIS — R209 Unspecified disturbances of skin sensation: Secondary | ICD-10-CM | POA: Insufficient documentation

## 2021-09-24 DIAGNOSIS — E2839 Other primary ovarian failure: Secondary | ICD-10-CM | POA: Insufficient documentation

## 2021-09-24 DIAGNOSIS — N951 Menopausal and female climacteric states: Secondary | ICD-10-CM | POA: Insufficient documentation

## 2021-09-24 DIAGNOSIS — E7800 Pure hypercholesterolemia, unspecified: Secondary | ICD-10-CM | POA: Insufficient documentation

## 2021-09-24 DIAGNOSIS — Z860100 Personal history of colon polyps, unspecified: Secondary | ICD-10-CM | POA: Insufficient documentation

## 2021-09-24 DIAGNOSIS — K589 Irritable bowel syndrome without diarrhea: Secondary | ICD-10-CM | POA: Insufficient documentation

## 2021-09-24 NOTE — Progress Notes (Signed)
Office Note     CC:  follow up Requesting Provider:  Kelton Pillar, MD  HPI: Madeline Richmond is a 61 y.o. (27-Oct-1960) female who presents for evaluation of spider veins of bilateral lower extremities right more so than left lower leg.  Patient states she had sclerotherapy of the right leg about 25 years ago however over time the veins have returned.  She denies any significant edema.  She also has not had any history of DVT or venous ulcerations.  She only wears compression when on long car or plane rides.  She does not make an effort to elevate her legs during the day.  She is not a tobacco user.  She is interested in having sclerotherapy performed again.   Past Medical History:  Diagnosis Date   Anxiety    Depression    Eczema    GERD (gastroesophageal reflux disease)     Past Surgical History:  Procedure Laterality Date   BREAST BIOPSY Left 1996   BREAST EXCISIONAL BIOPSY Left 1996   benign   EYE SURGERY      Social History   Socioeconomic History   Marital status: Married    Spouse name: Not on file   Number of children: Not on file   Years of education: Not on file   Highest education level: Not on file  Occupational History   Not on file  Tobacco Use   Smoking status: Never   Smokeless tobacco: Never  Substance and Sexual Activity   Alcohol use: Yes    Alcohol/week: 7.0 standard drinks of alcohol    Types: 7 Glasses of wine per week   Drug use: No   Sexual activity: Yes    Birth control/protection: I.U.D.  Other Topics Concern   Not on file  Social History Narrative   Not on file   Social Determinants of Health   Financial Resource Strain: Not on file  Food Insecurity: Not on file  Transportation Needs: Not on file  Physical Activity: Not on file  Stress: Not on file  Social Connections: Not on file  Intimate Partner Violence: Not on file    Family History  Problem Relation Age of Onset   Arthritis Mother    Cancer Father    Drug abuse  Father    Hypertension Father    Cancer Sister    Breast cancer Sister     Current Outpatient Medications  Medication Sig Dispense Refill   ALPRAZolam (XANAX) 0.5 MG tablet 1/2 tablet     FLUoxetine (PROZAC) 20 MG capsule TAKE ONE CAPSULE BY MOUTH DAILY (Patient taking differently: Take 20 mg by mouth daily.) 30 capsule 0   omeprazole (PRILOSEC) 40 MG capsule Take 1 capsule (40 mg total) by mouth daily for 14 days. 14 capsule 0   No current facility-administered medications for this visit.    Allergies  Allergen Reactions   Codeine Camsylate [Codeine] Nausea And Vomiting     REVIEW OF SYSTEMS:   '[X]'$  denotes positive finding, '[ ]'$  denotes negative finding Cardiac  Comments:  Chest pain or chest pressure:    Shortness of breath upon exertion:    Short of breath when lying flat:    Irregular heart rhythm:        Vascular    Pain in calf, thigh, or hip brought on by ambulation:    Pain in feet at night that wakes you up from your sleep:     Blood clot in your veins:  Leg swelling:         Pulmonary    Oxygen at home:    Productive cough:     Wheezing:         Neurologic    Sudden weakness in arms or legs:     Sudden numbness in arms or legs:     Sudden onset of difficulty speaking or slurred speech:    Temporary loss of vision in one eye:     Problems with dizziness:         Gastrointestinal    Blood in stool:     Vomited blood:         Genitourinary    Burning when urinating:     Blood in urine:        Psychiatric    Major depression:         Hematologic    Bleeding problems:    Problems with blood clotting too easily:        Skin    Rashes or ulcers:        Constitutional    Fever or chills:      PHYSICAL EXAMINATION:  Vitals:   09/24/21 1231  BP: 125/75  Pulse: 61  Resp: 20  Temp: 98.1 F (36.7 C)  TempSrc: Temporal  SpO2: 99%  Weight: 188 lb 6.4 oz (85.5 kg)  Height: '5\' 8"'$  (1.727 m)    General:  WDWN in NAD; vital signs documented  above Gait: Not observed HENT: WNL, normocephalic Pulmonary: normal non-labored breathing , without Rales, rhonchi,  wheezing Cardiac: regular HR Abdomen: soft, NT, no masses Skin: without rashes Vascular Exam/Pulses:  Right Left  PT 2+ (normal) 2+ (normal)   Extremities: without ischemic changes, without Gangrene , without cellulitis; without open wounds;  Musculoskeletal: no muscle wasting or atrophy  Neurologic: A&O X 3;  No focal weakness or paresthesias are detected Psychiatric:  The pt has Normal affect.        Non-Invasive Vascular Imaging:   Right lower extremity venous reflux study Negative for DVT Deep venous reflux at the common femoral vein Incompetent greater saphenous vein however diameter is less than 4 mm throughout the thigh Incompetent small saphenous vein however diameter is less than 3 mm throughout the calf    ASSESSMENT/PLAN:: 61 y.o. female here for evaluation of spider veins of right more than the left leg  -Patient has noticed return of spider veins of bilateral lower legs having had sclerotherapy about 25 years ago -Right lower extremity venous reflux study negative for DVT.  She has evidence of mild deep venous reflux involving the common femoral vein.  She does have superficial venous reflux in the saphenous vein and small saphenous vein however the diameter of these veins are very small and would not be a candidate for laser ablation therapy -I provided the patient with Estell Harpin, RN card as she is interested in sclerotherapy -Patient can also wear 15 to 20 mmHg compression knee-high regularly, elevate her legs above her heart periodically throughout the day, and avoid prolonged sitting and standing -Patient can otherwise follow-up on an as-needed basis   Dagoberto Ligas, PA-C [facility] [phone]  Clinic MD:   Trula Slade

## 2021-10-15 ENCOUNTER — Other Ambulatory Visit: Payer: Self-pay | Admitting: Family Medicine

## 2021-10-15 DIAGNOSIS — Z09 Encounter for follow-up examination after completed treatment for conditions other than malignant neoplasm: Secondary | ICD-10-CM

## 2021-10-23 ENCOUNTER — Ambulatory Visit: Payer: 59

## 2021-10-23 DIAGNOSIS — I8393 Asymptomatic varicose veins of bilateral lower extremities: Secondary | ICD-10-CM

## 2021-10-23 DIAGNOSIS — M7989 Other specified soft tissue disorders: Secondary | ICD-10-CM

## 2021-10-23 NOTE — Progress Notes (Signed)
Treated pt's small spider and reticular veins of both lower legs with Asclera 1% administered with a 27g butterfly.  Patient received a total of 2 mL. Pt was very jumpy, has difficulties with needle sticks and sensitive skin to needles, as she c/o pain when just rubbing cotton swab with alcohol along shin area. Pt is aware she will likely need further treatments, as we only did one syringe today. She was very concerned about blood clots and what to watch out for after sclerotherapy. She was given post treatment care instructions on both a handout and verbally. Will follow PRN.   Photos: Yes.    Compression stockings applied: Yes.

## 2021-11-29 ENCOUNTER — Other Ambulatory Visit: Payer: 59

## 2021-12-03 ENCOUNTER — Ambulatory Visit
Admission: RE | Admit: 2021-12-03 | Discharge: 2021-12-03 | Disposition: A | Discharge status: Home / Self Care | Payer: 59 | Source: Ambulatory Visit | Attending: Family Medicine | Admitting: Family Medicine

## 2021-12-03 DIAGNOSIS — Z09 Encounter for follow-up examination after completed treatment for conditions other than malignant neoplasm: Secondary | ICD-10-CM

## 2021-12-13 ENCOUNTER — Telehealth: Payer: Self-pay | Admitting: Genetic Counselor

## 2021-12-13 NOTE — Telephone Encounter (Signed)
Scheduled appt per 9/26 referral. Pt is aware of appt date and time. Pt is aware to arrive 15 mins prior to appt time and to bring and updated insurance card. Pt is aware of appt location.   

## 2021-12-18 ENCOUNTER — Other Ambulatory Visit: Payer: Self-pay | Admitting: Nurse Practitioner

## 2021-12-18 ENCOUNTER — Other Ambulatory Visit (HOSPITAL_COMMUNITY)
Admission: RE | Admit: 2021-12-18 | Discharge: 2021-12-18 | Disposition: A | Discharge status: Home / Self Care | Payer: 59 | Source: Ambulatory Visit | Attending: Nurse Practitioner | Admitting: Nurse Practitioner

## 2021-12-18 DIAGNOSIS — Z124 Encounter for screening for malignant neoplasm of cervix: Secondary | ICD-10-CM | POA: Diagnosis present

## 2021-12-19 LAB — CYTOLOGY - PAP
Comment: NEGATIVE
Diagnosis: NEGATIVE
High risk HPV: NEGATIVE

## 2022-02-11 ENCOUNTER — Inpatient Hospital Stay: Payer: 59 | Attending: Genetic Counselor | Admitting: Genetic Counselor

## 2022-02-11 ENCOUNTER — Other Ambulatory Visit: Payer: Self-pay | Admitting: Genetic Counselor

## 2022-02-11 ENCOUNTER — Inpatient Hospital Stay: Payer: 59

## 2022-02-11 DIAGNOSIS — Z803 Family history of malignant neoplasm of breast: Secondary | ICD-10-CM

## 2022-02-11 NOTE — Progress Notes (Deleted)
REFERRING PROVIDER: Mckinley Jewel, MD [address] [place],  [place] [address]  PRIMARY PROVIDER:  Kelton Pillar, MD  PRIMARY REASON FOR VISIT:  No diagnosis found.   HISTORY OF PRESENT ILLNESS:   Ms. Conover, a 61 y.o. female, was seen for a [facility] cancer genetics consultation at the request of Dr. Doristine Bosworth due to a family history of breast cancer.  Ms. Hinde presents to clinic today to discuss the possibility of a hereditary predisposition to cancer, to discuss genetic testing, and to further clarify her future cancer risks, as well as potential cancer risks for family members.   In ***, at the age of ***, Ms. Borelli was diagnosed with {CA PATHOLOGY:63853} of the {right left (wildcard):15202} {CA XLKGM:01027}. The treatment plan ***.    *** Ms. Fear is a 61 y.o. female with no personal history of cancer.    CANCER HISTORY:  Oncology History   No history exists.     RISK FACTORS:  Mammogram within the last year: yes; most recent 11/2021; category b breast density  Number of breast biopsies: {Numbers 1-12 multi-select:20307}. L breast biopsy in 1996*** Colonoscopy: yes;  most recent in 2020 . Hysterectomy: {Yes/No-Ex:120004}.  Ovaries intact: {Yes/No-Ex:120004}.  Up to date with pelvic exams: yes; most recent in 2023 Menarche was at age ***.  First live birth at age ***.  Menopausal status: {Menopause:31378}.  OCP use for approximately {Numbers 1-12 multi-select:20307} years.  HRT use: {Numbers 1-12 multi-select:20307} years. Dermatology screening: ***  Past Medical History:  Diagnosis Date   Anxiety    Depression    Eczema    GERD (gastroesophageal reflux disease)     Past Surgical History:  Procedure Laterality Date   BREAST BIOPSY Left 1996   BREAST EXCISIONAL BIOPSY Left 1996   benign   EYE SURGERY      Social History   Socioeconomic History   Marital status: Married    Spouse name: Not on file   Number of children: Not on file    Years of education: Not on file   Highest education level: Not on file  Occupational History   Not on file  Tobacco Use   Smoking status: Never   Smokeless tobacco: Never  Substance and Sexual Activity   Alcohol use: Yes    Alcohol/week: 7.0 standard drinks of alcohol    Types: 7 Glasses of wine per week   Drug use: No   Sexual activity: Yes    Birth control/protection: I.U.D.  Other Topics Concern   Not on file  Social History Narrative   Not on file   Social Determinants of Health   Financial Resource Strain: Not on file  Food Insecurity: Not on file  Transportation Needs: Not on file  Physical Activity: Not on file  Stress: Not on file  Social Connections: Not on file     FAMILY HISTORY:  We obtained a detailed, 4-generation family history.  Significant diagnoses are listed below: Family History  Problem Relation Age of Onset   Arthritis Mother    Cancer Father    Drug abuse Father    Hypertension Father    Cancer Sister    Breast cancer Sister     Ms. Umholtz is {aware/unaware} of previous family history of genetic testing for hereditary cancer risks. Patient's maternal ancestors are of *** descent, and paternal ancestors are of *** descent. There {IS NO:12509} reported Ashkenazi Jewish ancestry. There {IS NO:12509} known consanguinity.  GENETIC COUNSELING ASSESSMENT: Ms. Durante  is a 61 y.o. female with a {Personal/family:20331} history of {cancer/polyps} which is somewhat suggestive of a {DISEASE} and predisposition to cancer given ***. We, therefore, discussed and recommended the following at today's visit.   DISCUSSION: We discussed that *** - ***% of *** is hereditary, with most cases of hereditary *** cancer associated with ***.  There are other genes that can be associated with hereditary *** cancer syndromes.  These include ***.  We discussed that testing is beneficial for several reasons, including knowing about other cancer risks, identifying potential  screening and risk-reduction options that may be appropriate, and to understanding if other family members could be at risk for cancer and allowing them to undergo genetic testing.  We reviewed the characteristics, features and inheritance patterns of hereditary cancer syndromes. We also discussed genetic testing, including the appropriate family members to test, the process of testing, insurance coverage and turn-around-time for results. We discussed the implications of a negative, positive, and variant of uncertain significant result. ***We discussed that negative results would be uninformative given that Ms. Maahs does not have a personal history of cancer. We recommended Ms. Wince pursue genetic testing for a panel that contains genes associated with ***.  Ms. Vanes was offered a common hereditary cancer panel (48 genes) and an expanded pan-cancer panel (85 genes). Ms. Vasudevan was informed of the benefits and limitations of each panel, including that expanded pan-cancer panels contain several genes that do not have clear management guidelines at this point in time.  We also discussed that as the number of genes included on a panel increases, the chances of variants of uncertain significance increases.  After considering the benefits and limitations of each gene panel, Ms. Ormiston elected to have an *** through ***.   Based on Ms. Gallogly's {Personal/family:20331} history of cancer, she meets medical criteria for genetic testing. Despite that she meets criteria, she may still have an out of pocket cost. We discussed that if her out of pocket cost for testing is over $100, the laboratory should contact them to discuss self-pay options and/or patient pay assistance programs.   ***We reviewed the characteristics, features and inheritance patterns of hereditary cancer syndromes. We also discussed genetic testing, including the appropriate family members to test, the process of testing, insurance  coverage and turn-around-time for results. We discussed the implications of a negative, positive and/or variant of uncertain significant result. In order to get genetic test results in a timely manner so that Ms. Breden can use these genetic test results for surgical decisions, we recommended Ms. Oconnor pursue genetic testing for the ***. Once complete, we recommend Ms. Pursley pursue reflex genetic testing to the *** gene panel.   Based on Ms. Ferrando's {Personal/family:20331} history of cancer, she meets medical criteria for genetic testing. Despite that she meets criteria, she may still have an out of pocket cost.   ***We discussed with Ms. Omary that the {Personal/family:20331} history does not meet insurance or NCCN criteria for genetic testing and, therefore, is not highly consistent with a familial hereditary cancer syndrome.  We feel she is at low risk to harbor a gene mutation associated with such a condition. Thus, we did not recommend any genetic testing, at this time, and recommended Ms. Fetterolf continue to follow the cancer screening guidelines given by her primary healthcare provider.  ***In order to estimate her chance of having a {CA GENE:62345} mutation, we used statistical models ({GENMODELS:62370}) that consider her personal medical history, family history and ancestry.  Because  each model is different, there can be a lot of variability in the risks they give.  Therefore, these numbers must be considered a rough range and not a precise risk of having a {CA GENE:62345} mutation.  These models estimate that she has approximately a ***-***% chance of having a mutation. Based on this assessment of her family and personal history, genetic testing {IS/ISNOT:34056} recommended.  ***Based on the patient's {Personal/family:20331} history, a statistical model ({GENMODELS:62370}) was used to estimate her risk of developing {CA HX:54794}. This estimates her lifetime risk of developing {CA  HX:54794} to be approximately ***%. This estimation does not consider any genetic testing results.  The patient's lifetime breast cancer risk is a preliminary estimate based on available information using one of several models endorsed by the [employer] (ACS). The ACS recommends consideration of breast MRI screening as an adjunct to mammography for patients at high risk (defined as 20% or greater lifetime risk).   ***Ms. Sowash has been determined to be at high risk for breast cancer.  Therefore, we recommend that annual screening with mammography and breast MRI be performed.  ***begin at age 26, or 10 years prior to the age of breast cancer diagnosis in a relative (whichever is earlier).  We discussed that Ms. Rund should discuss her individual situation with her referring physician and determine a breast cancer screening plan with which they are both comfortable.    We discussed the Genetic Information Non-Discrimination Act (GINA) of 2008, which helps protect individuals against genetic discrimination based on their genetic test results.  It impacts both health insurance and employment.  With health insurance, it protects against genetic test results being used for increased premiums or policy termination. For employment, it protects against hiring, firing and promoting decisions based on genetic test results.  GINA does not apply to those in the [employer], those who work for companies with less than 15 employees, and new life insurance or long-term disability insurance policies.  Health status due to a cancer diagnosis is not protected under GINA.  PLAN: After considering the risks, benefits, and limitations, Ms. Armentrout provided informed consent to pursue genetic testing and the blood sample was sent to {Lab} Laboratories for analysis of the {test}. Results should be available within approximately {TAT TIME} weeks' time, at which point they will be disclosed by telephone to Ms. Mackie, as  will any additional recommendations warranted by these results. Ms. Ricke will receive a summary of her genetic counseling visit and a copy of her results once available. This information will also be available in Epic.   *** Despite our recommendation, Ms. Sowash did not wish to pursue genetic testing at today's visit. We understand this decision and remain available to coordinate genetic testing at any time in the future. We, therefore, recommend Ms. Cadman continue to follow the cancer screening guidelines given by her primary healthcare provider.  ***Based on Ms. Egloff's family history, we recommended her ***, who was diagnosed with *** at age ***, have genetic counseling and testing. Ms. Govan will let us know if we can be of any assistance in coordinating genetic counseling and/or testing for this family member.   Lastly, we encouraged Ms. Som to remain in contact with cancer genetics annually so that we can continuously update the family history and inform her of any changes in cancer genetics and testing that may be of benefit for this family.   Ms. Deziel questions were answered to her satisfaction today. Our contact information was provided  should additional questions or concerns arise. ***Thank you for the referral and allowing [place] to share in the care of your patient.   Nioma Mccubbins M. Joette Catching, [place], [facility] Genetic Counselor [email or web address] (P) [phone]   The patient was seen for a total of *** minutes in face-to-face genetic counseling.  ***The was patient was accompanied by ***.  ***The patient was seen alone.  Drs. Lindi Adie and/or Burr Medico were available to discuss this case as needed.  _______________________________________________________________________ For Office Staff:  Number of people involved in session: *** Was an Intern/ student involved with case: {YES/NO:63}

## 2022-02-11 NOTE — Addendum Note (Signed)
Addended by: Ignacia Bayley on: 02/11/2022 09:36 AM   Modules accepted: Orders

## 2022-02-28 ENCOUNTER — Inpatient Hospital Stay: Payer: 59 | Attending: Genetic Counselor | Admitting: Genetic Counselor

## 2022-02-28 ENCOUNTER — Inpatient Hospital Stay: Payer: 59

## 2022-02-28 ENCOUNTER — Encounter: Payer: Self-pay | Admitting: Genetic Counselor

## 2022-02-28 DIAGNOSIS — Z803 Family history of malignant neoplasm of breast: Secondary | ICD-10-CM | POA: Insufficient documentation

## 2022-02-28 DIAGNOSIS — Z8042 Family history of malignant neoplasm of prostate: Secondary | ICD-10-CM

## 2022-02-28 NOTE — Progress Notes (Signed)
REFERRING PROVIDER: Mckinley Jewel, MD [address] [place],  [place] [address]  PRIMARY PROVIDER:  Kelton Pillar, MD  PRIMARY REASON FOR VISIT:  Encounter Diagnoses  Name Primary?   Family history of breast cancer Yes   Family history of prostate cancer in father     HISTORY OF PRESENT ILLNESS:   Ms. Pettibone, a 61 y.o. female, was seen for a [facility] cancer genetics consultation at the request of Dr. Doristine Bosworth due to a family history of cancer.  Ms. Scarpelli presents to clinic today to discuss the possibility of a hereditary predisposition to cancer, to discuss genetic testing, and to further clarify her future cancer risks, as well as potential cancer risks for family members.   Ms. Harries is a 61 y.o. female with no personal history of cancer.    RISK FACTORS:  Menarche was at age 4.  First live birth at age 79.  OCP use for approximately 10+ years.  Ovaries intact: yes.  Uterus intact: yes.  Menopausal status: postmenopausal, menopause at 79  HRT use: 0 years. Colonoscopy: yes;  reports a history of colon polyps . Mammogram within the last year: yes. Number of breast biopsies: 1 benign biopsy Up to date with pelvic exams: yes. Any excessive radiation exposure in the past: no  Past Medical History:  Diagnosis Date   Anxiety    Depression    Eczema    GERD (gastroesophageal reflux disease)     Past Surgical History:  Procedure Laterality Date   BREAST BIOPSY Left 1996   BREAST EXCISIONAL BIOPSY Left 1996   benign   EYE SURGERY      Social History   Socioeconomic History   Marital status: Married    Spouse name: Not on file   Number of children: Not on file   Years of education: Not on file   Highest education level: Not on file  Occupational History   Not on file  Tobacco Use   Smoking status: Never   Smokeless tobacco: Never  Substance and Sexual Activity   Alcohol use: Yes    Alcohol/week: 7.0 standard drinks of alcohol    Types:  7 Glasses of wine per week   Drug use: No   Sexual activity: Yes    Birth control/protection: I.U.D.  Other Topics Concern   Not on file  Social History Narrative   Not on file   Social Determinants of Health   Financial Resource Strain: Not on file  Food Insecurity: Not on file  Transportation Needs: Not on file  Physical Activity: Not on file  Stress: Not on file  Social Connections: Not on file     FAMILY HISTORY:  We obtained a detailed, 4-generation family history.  Significant diagnoses are listed below: Family History  Problem Relation Age of Onset   Arthritis Mother    Prostate cancer Father 38       metastatic   Drug abuse Father    Hypertension Father    Breast cancer Sister 45   Melanoma Brother      Ms. Bittick's sister was diagnosed with breast cancer at age 64, she declined genetic testing. Her brother was diagnosed with melanoma in his 77s. Her father was diagnosed with prostate cancer at age 23, he died due to metastatic prostate cancer at age 48. Ms. Dillow is unaware of previous family history of genetic testing for hereditary cancer risks. There is no reported Ashkenazi Jewish ancestry.   GENETIC COUNSELING ASSESSMENT:  Ms. Honold is a 61 y.o. female with a family history of cancer which is somewhat suggestive of a hereditary predisposition to cancer given her sister's young age at diagnosis of breast cancer and her father's metastatic prostate cancer. We, therefore, discussed and recommended the following at today's visit.   DISCUSSION: We discussed that 5 - 10% of cancer is hereditary, with most cases of hereditary breast cancer associated with BRCA1/2.  There are other genes that can be associated with hereditary breast cancer syndromes.  We discussed that testing is beneficial for several reasons, including knowing about other cancer risks, identifying potential screening and risk-reduction options that may be appropriate, and to understanding if other  family members could be at risk for cancer and allowing them to undergo genetic testing.  We reviewed the characteristics, features and inheritance patterns of hereditary cancer syndromes. We also discussed genetic testing, including the appropriate family members to test, the process of testing, insurance coverage and turn-around-time for results. We discussed the implications of a negative, positive, carrier and/or variant of uncertain significant result. We discussed that negative results would be uninformative given that Ms. Zuk does not have a personal history of cancer. We recommended Ms. Bram pursue genetic testing for a panel that contains genes associated with breast cancer, prostate cancer, and melanoma.  Ms. Asfour was offered a common hereditary cancer panel (51 genes) and an expanded pan-cancer panel (77 genes). Ms. Rettinger was informed of the benefits and limitations of each panel, including that expanded pan-cancer panels contain several genes that do not have clear management guidelines at this point in time.  We also discussed that as the number of genes included on a panel increases, the chances of variants of uncertain significance increases.  After considering the benefits and limitations of each gene panel, Ms. Fowle elected to have [facility] CustomNext Panel.  The CustomNext-Cancer+RNAinsight panel offered by [facility] includes sequencing and rearrangement analysis for the following 51 genes:  APC, ATM, AXIN2, BAP1, BARD1, BMPR1A, BRCA1, BRCA2, BRIP1, CDH1, CDK4, CDKN2A, CHEK2, CTNNA1, DICER1, EPCAM, GREM1, HOXB13, KIT, MEN1 MITF, MLH1, MSH2, MSH3, MSH6, MUTYH, NBN, NF1, NTHL1, PALB2, PDGFRA, PMS2, POLD1, POLE, POT1, PTEN, RAD50, RAD51C, RAD51D, RB1, SDHA, SDHB, SDHC, SDHD, SMAD4, SMARCA4, STK11, TP53, TSC1, TSC2, and VHL.  RNA data is routinely analyzed for use in variant interpretation for all genes.  Based on Ms. Petrosino's family history of cancer, she meets medical  criteria for genetic testing. Despite that she meets criteria, she may still have an out of pocket cost. We discussed that if her out of pocket cost for testing is over $100, the laboratory will call and confirm whether she wants to proceed with testing.  If the out of pocket cost of testing is less than $100 she will be billed by the genetic testing laboratory.   We discussed that some people do not want to undergo genetic testing due to fear of genetic discrimination.  A federal law called the Genetic Information Non-Discrimination Act (GINA) of 2008 helps protect individuals against genetic discrimination based on their genetic test results.  It impacts both health insurance and employment.  With health insurance, it protects against increased premiums, being kicked off insurance or being forced to take a test in order to be insured.  For employment it protects against hiring, firing and promoting decisions based on genetic test results.  GINA does not apply to those in the [employer], those who work for companies with less than 15 employees, and new life insurance  or long-term disability Engineer, structural.  Health status due to a cancer diagnosis is not protected under GINA.  PLAN: After considering the risks, benefits, and limitations, Ms. Storbeck provided informed consent to pursue genetic testing and the blood sample was sent to [facility] for analysis of the CustomNext Panel. Results should be available within approximately 2-3 weeks' time, at which point they will be disclosed by telephone to Ms. Warwick, as will any additional recommendations warranted by these results. Ms. Costin will receive a summary of her genetic counseling visit and a copy of her results once available. This information will also be available in Epic.   Ms. Pickerel questions were answered to her satisfaction today. Our contact information was provided should additional questions or concerns arise. Thank you for the  referral and allowing [place] to share in the care of your patient.   Lucille Passy, MS, [facility] Genetic Counselor [email or web address] (P) [phone]  The patient was seen for a total of 30 minutes in face-to-face genetic counseling.  The patient was seen alone.  Drs. Lindi Adie and/or Burr Medico were available to discuss this case as needed.  _______________________________________________________________________ For Office Staff:  Number of people involved in session: 1 Was an Intern/ student involved with case: no

## 2022-03-19 ENCOUNTER — Encounter: Payer: Self-pay | Admitting: Genetic Counselor

## 2022-03-19 ENCOUNTER — Telehealth: Payer: Self-pay | Admitting: Genetic Counselor

## 2022-03-19 DIAGNOSIS — Z1379 Encounter for other screening for genetic and chromosomal anomalies: Secondary | ICD-10-CM | POA: Insufficient documentation

## 2022-03-19 DIAGNOSIS — Z9189 Other specified personal risk factors, not elsewhere classified: Secondary | ICD-10-CM | POA: Insufficient documentation

## 2022-03-19 NOTE — Telephone Encounter (Signed)
I attempted to contact Madeline Richmond to discuss her genetic testing results (51 genes). I left a voicemail requesting she call me back at [phone].  Lucille Passy, MS, [facility] Genetic Counselor [email or web address] (P) [phone]

## 2022-04-04 ENCOUNTER — Encounter: Payer: Self-pay | Admitting: Genetic Counselor

## 2022-04-04 ENCOUNTER — Telehealth: Payer: Self-pay | Admitting: Genetic Counselor

## 2022-04-04 NOTE — Telephone Encounter (Signed)
I contacted Ms. Cooler to discuss her genetic testing results. No pathogenic variants were identified in the 51 genes analyzed. Detailed clinic note to follow.  The test report has been scanned into EPIC and is located under the Molecular Pathology section of the Results Review tab.  A portion of the result report is included below for reference.   Lucille Passy, MS, [facility] Genetic Counselor [email or web address] (P) [phone]

## 2022-04-09 ENCOUNTER — Ambulatory Visit: Payer: Self-pay | Admitting: Genetic Counselor

## 2022-04-09 DIAGNOSIS — Z1379 Encounter for other screening for genetic and chromosomal anomalies: Secondary | ICD-10-CM

## 2022-04-12 NOTE — Progress Notes (Signed)
HPI:   Ms. Rude was previously seen in the [place] clinic due to a family history of cancer and concerns regarding a hereditary predisposition to cancer. Please refer to our prior cancer genetics clinic note for more information regarding our discussion, assessment and recommendations, at the time. Ms. Devito recent genetic test results were disclosed to her, as were recommendations warranted by these results. These results and recommendations are discussed in more detail below.  CANCER HISTORY:  Oncology History   No history exists.    FAMILY HISTORY:  We obtained a detailed, 4-generation family history.  Significant diagnoses are listed below:      Family History  Problem Relation Age of Onset   Arthritis Mother     Prostate cancer Father 53        metastatic   Drug abuse Father     Hypertension Father     Breast cancer Sister 60   Melanoma Brother         Ms. Schley's sister was diagnosed with breast cancer at age 19, she declined genetic testing. Her brother was diagnosed with melanoma in his 108s. Her father was diagnosed with prostate cancer at age 49, he died due to metastatic prostate cancer at age 65. Ms. Newnam is unaware of previous family history of genetic testing for hereditary cancer risks. There is no reported Ashkenazi Jewish ancestry.   GENETIC TEST RESULTS:  The [facility] CustomNext Panel found no pathogenic mutations.    The CustomNext-Cancer+RNAinsight panel offered by [facility] includes sequencing and rearrangement analysis for the following 51 genes:  APC, ATM, AXIN2, BAP1, BARD1, BMPR1A, BRCA1, BRCA2, BRIP1, CDH1, CDK4, CDKN2A, CHEK2, CTNNA1, DICER1, EPCAM, GREM1, HOXB13, KIT, MEN1 MITF, MLH1, MSH2, MSH3, MSH6, MUTYH, NBN, NF1, NTHL1, PALB2, PDGFRA, PMS2, POLD1, POLE, POT1, PTEN, RAD50, RAD51C, RAD51D, RB1, SDHA, SDHB, SDHC, SDHD, SMAD4, SMARCA4, STK11, TP53, TSC1, TSC2, and VHL.  RNA data is routinely analyzed for use in variant  interpretation for all genes.  The test report has been scanned into EPIC and is located under the Molecular Pathology section of the Results Review tab.  A portion of the result report is included below for reference. Genetic testing reported out on 03/12/2022.       Even though a pathogenic variant was not identified, possible explanations for the cancer in the family may include: There may be no hereditary risk for cancer in the family. The cancers in her family may be due to other genetic or environmental factors. There may be a gene mutation in one of these genes that current testing methods cannot detect, but that chance is small. There could be another gene that has not yet been discovered, or that we have not yet tested, that is responsible for the cancer diagnoses in the family.  It is also possible there is a hereditary cause for the cancer in the family that Ms. Lamour did not inherit.  Therefore, it is important to remain in touch with cancer genetics in the future so that we can continue to offer Ms. Leever the most up to date genetic testing.   ADDITIONAL GENETIC TESTING:  We discussed with Ms. Kooi that her genetic testing was fairly extensive.  If there are genes identified to increase cancer risk that can be analyzed in the future, we would be happy to discuss and coordinate this testing at that time.    CANCER SCREENING RECOMMENDATIONS:  Ms. Lampley test result is considered negative (normal).  This means that we  have not identified a hereditary cause for her family history of cancer at this time. An individual's cancer risk and medical management are not determined by genetic test results alone. Overall cancer risk assessment incorporates additional factors, including personal medical history, family history, and any available genetic information that may result in a personalized plan for cancer prevention and surveillance. Therefore, it is recommended she continue to  follow the cancer management and screening guidelines provided by her primary healthcare provider.  Based on the reported personal and family history, specific cancer screenings for Ms. Sinclair Arrazola and her family include:  Breast Cancer Screening:  The Tyrer-Cuzick model is one of multiple prediction models developed to estimate an individual's lifetime risk of developing breast cancer. The Tyrer-Cuzick model is endorsed by the [employer]  (NCCN). This model includes many risk factors such as family history, endogenous estrogen exposure, and benign breast disease. The calculation is highly-dependent on the accuracy of clinical data provided by the patient and can change over time. The Tyrer-Cuzick model may be repeated to reflect new information in her personal or family history in the future.   Ms. Seidenberg'sTyrer-Cuzick risk score is 18.3%. This is above the general population risk of 13%, therefore, she is encouraged to continue to be mindful of her family history and be diligent with general population breast screening, including annual mammograms. She is encouraged to contact us regarding any changes to her personal or family history, as her recommendations for screening would be altered significantly if her lifetime risk is determined to be greater than 20% based on updated information.    RECOMMENDATIONS FOR FAMILY MEMBERS:   Since she did not inherit a mutation in a cancer predisposition gene included on this panel, her daughter could not have inherited a mutation from her in one of these genes. Individuals in this family might be at some increased risk of developing cancer, over the general population risk, due to the family history of cancer. We recommend women in this family have a yearly mammogram beginning at age 80, or 42 years younger than the earliest onset of cancer, an annual clinical breast exam, and perform monthly breast self-exams.  Other members of  the family may still carry a pathogenic variant in one of these genes that Ms. Pall did not inherit. Based on the family history, we recommend her sister who was diagnosed with breast cancer have genetic counseling and testing.   FOLLOW-UP:  Cancer genetics is a rapidly advancing field and it is possible that new genetic tests will be appropriate for her and/or her family members in the future. We encouraged her to remain in contact with cancer genetics on an annual basis so we can update her personal and family histories and let her know of advances in cancer genetics that may benefit this family.   Our contact number was provided. Ms. Hebert questions were answered to her satisfaction, and she knows she is welcome to call us at anytime with additional questions or concerns.   Lucille Passy, MS, [facility] Genetic Counselor [email or web address] (P) [phone]

## 2022-07-17 ENCOUNTER — Ambulatory Visit (HOSPITAL_COMMUNITY)
Admission: EM | Admit: 2022-07-17 | Discharge: 2022-07-17 | Disposition: A | Discharge status: Home / Self Care | Payer: 59 | Attending: Internal Medicine | Admitting: Internal Medicine

## 2022-07-17 ENCOUNTER — Ambulatory Visit (INDEPENDENT_AMBULATORY_CARE_PROVIDER_SITE_OTHER): Payer: 59

## 2022-07-17 ENCOUNTER — Encounter (HOSPITAL_COMMUNITY): Payer: Self-pay | Admitting: Emergency Medicine

## 2022-07-17 ENCOUNTER — Other Ambulatory Visit: Payer: Self-pay

## 2022-07-17 DIAGNOSIS — S93602A Unspecified sprain of left foot, initial encounter: Secondary | ICD-10-CM | POA: Diagnosis not present

## 2022-07-17 NOTE — Discharge Instructions (Addendum)
Wear ankle brace for a few days for support Continue to ice and elevate the foot Wear supportive tennis shoes It was nice to meet you today!

## 2022-07-17 NOTE — ED Provider Notes (Signed)
[facility]   [id number] 07/17/22 Arrival Time: 0831  ASSESSMENT & PLAN:  1. Sprain of left foot, initial encounter    -X-rays negative for fracture.  Reassurance was provided.  Will place her in a lace up ankle brace for support today.  Encouraged continued ice, elevation, Tylenol/ibuprofen as needed for pain.  All questions answered she agrees to plan.    No orders of the defined types were placed in this encounter.  Discharge Instructions   None       Reviewed expectations re: course of current medical issues. Questions answered. Outlined signs and symptoms indicating need for more acute intervention. Patient verbalized understanding. After Visit Summary given.   SUBJECTIVE: Pleasant 62 year old female comes urgent care to be evaluated for left foot pain.  Yesterday she was in the garden and was startled by an animal and fell backwards onto her backside.  She is unsure of the exact mechanism but she hurt her foot during this fall.  She describes pain at the top of the dorsum of the foot as well as laterally.  She started using ice and Tylenol.  Pain does radiate up the left leg little bit.  She has had difficulty rotating and flexing the foot.  No prior injury.  No numbness or tingling.  No LMP recorded. Patient is premenopausal. Past Surgical History:  Procedure Laterality Date   BREAST BIOPSY Left 1996   BREAST EXCISIONAL BIOPSY Left 1996   benign   EYE SURGERY       OBJECTIVE:  Vitals:   07/17/22 0922  BP: (!) 155/91  Pulse: 65  Resp: 20  Temp: 98.1 F (36.7 C)  TempSrc: Oral  SpO2: 97%     Physical Exam Vitals reviewed.  Constitutional:      General: She is not in acute distress. Cardiovascular:     Rate and Rhythm: Normal rate.  Pulmonary:     Effort: Pulmonary effort is normal.  Musculoskeletal:     Comments: Left ankle -no obvious deformity.  No erythema.  No ecchymoses.  Minimal swelling.  She is nontender to palpation at the medial or  lateral malleolus, navicular, or base of fifth metatarsal.  She is mildly tender to palpation along the dorsum of the midfoot.  Range of motion of the ankle is limited by pain.  She has a negative anterior drawer.  Negative talar tilt.  Neurovascularly intact distally.  She is able to bear weight on it.  Neurological:     Mental Status: She is alert.      Labs: Results for orders placed or performed in visit on 12/18/21  Cytology - PAP  Result Value Ref Range   High risk HPV Negative    Adequacy      Satisfactory for evaluation. The presence or absence of an   Adequacy      endocervical/transformation zone component cannot be determined because   Adequacy of atrophy.    Diagnosis      - Negative for intraepithelial lesion or malignancy (NILM)   Comment      A letter was sent to the patient informing her of the above results.   Comment Normal Reference Range HPV - Negative    Labs Reviewed - No data to display  Imaging: DG Ankle Complete Left  Result Date: 07/17/2022 CLINICAL DATA:  Pain EXAM: LEFT ANKLE COMPLETE - 3+ VIEW COMPARISON:  None Available. FINDINGS: There is no evidence of fracture, dislocation, or joint effusion. Small well corticated ossification along the  inferior margin of the medial malleolus suggesting sequela of remote trauma. Small enthesophyte at the Achilles tendon insertion site. Joint spaces appear preserved. Soft tissues are unremarkable. IMPRESSION: Negative. Electronically Signed   By: Duanne Guess D.O.   On: 07/17/2022 09:42     Allergies  Allergen Reactions   Codeine Camsylate [Codeine] Nausea And Vomiting                                               Past Medical History:  Diagnosis Date   Anxiety    Depression    Eczema    GERD (gastroesophageal reflux disease)     Social History   Socioeconomic History   Marital status: Married    Spouse name: Not on file   Number of children: Not on file   Years of education: Not on file   Highest  education level: Not on file  Occupational History   Not on file  Tobacco Use   Smoking status: Never   Smokeless tobacco: Never  Vaping Use   Vaping Use: Never used  Substance and Sexual Activity   Alcohol use: Yes    Alcohol/week: 7.0 standard drinks of alcohol    Types: 7 Glasses of wine per week   Drug use: No   Sexual activity: Yes  Other Topics Concern   Not on file  Social History Narrative   Not on file   Social Determinants of Health   Financial Resource Strain: Not on file  Food Insecurity: Not on file  Transportation Needs: Not on file  Physical Activity: Not on file  Stress: Not on file  Social Connections: Not on file  Intimate Partner Violence: Not on file    Family History  Problem Relation Age of Onset   Arthritis Mother    Prostate cancer Father 60       metastatic   Drug abuse Father    Hypertension Father    Breast cancer Sister 54   Melanoma Brother       Alesa Echevarria, Baldemar Friday, MD 07/17/22 1055

## 2022-07-17 NOTE — ED Triage Notes (Signed)
Larey Seat in the garden.  Fell backwards.  This occurred yesterday.  Patient touches top of left foot near ankle as painful area, including both sides .  Patient has used ice, tylenol.  Pain radiates up left leg.  Unable to rotate ankle or flex foot

## 2022-08-23 IMAGING — MG MM DIGITAL DIAGNOSTIC UNILAT*R* W/ TOMO W/ CAD
4 series · 4 of 12 positions shown · non-contrast
Comparison: Previous exam(s).

CLINICAL DATA: 59-year-old female recalled from screening mammogram
dated 10/26/2019 for a possible right breast mass.

EXAM:
DIGITAL DIAGNOSTIC RIGHT MAMMOGRAM WITH CAD AND TOMO
ULTRASOUND RIGHT BREAST

[R MLO synth-2D]
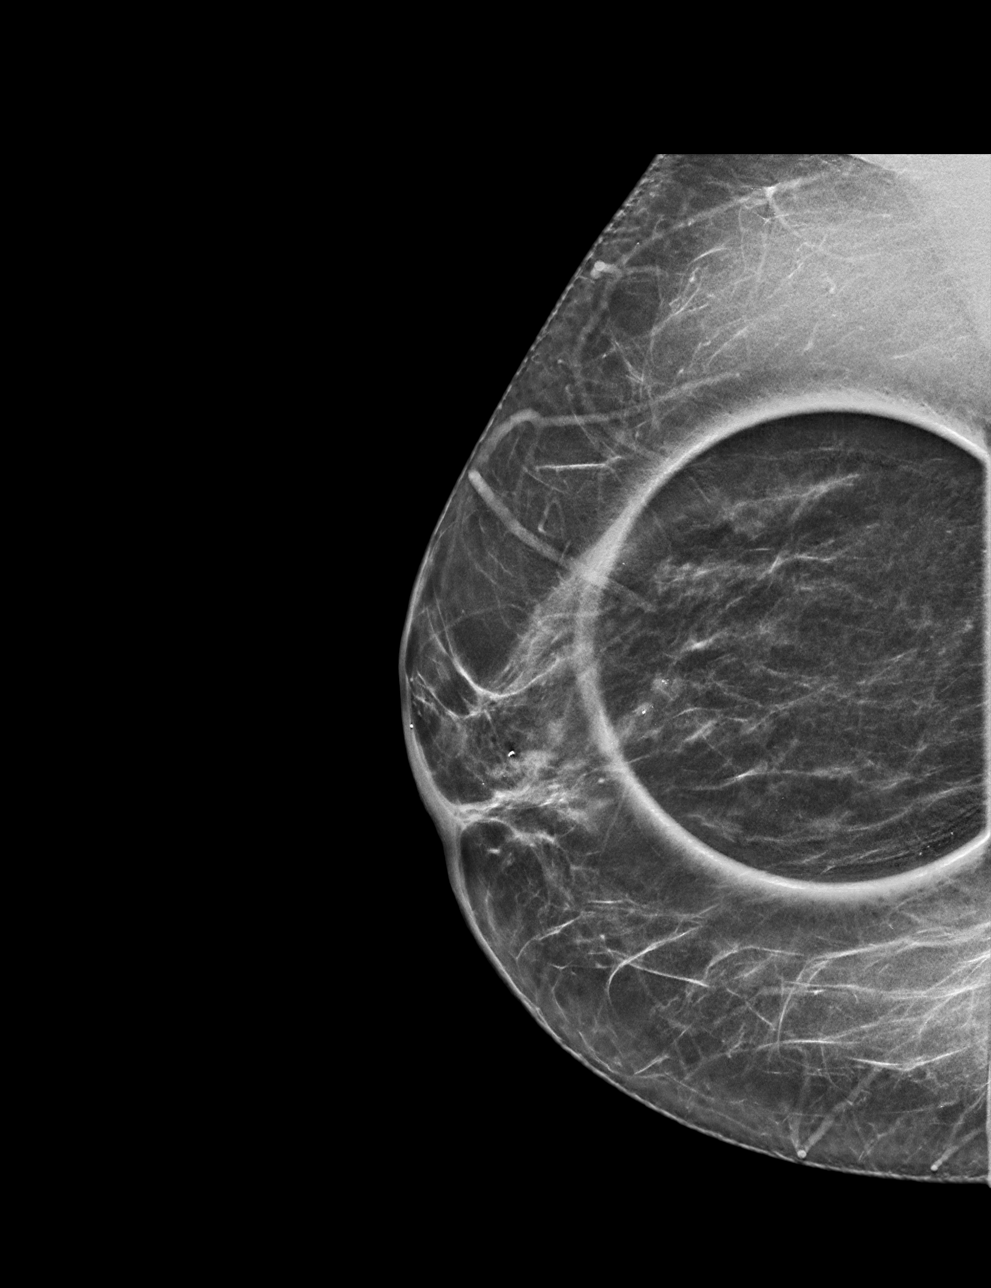

[R CC synth-2D]
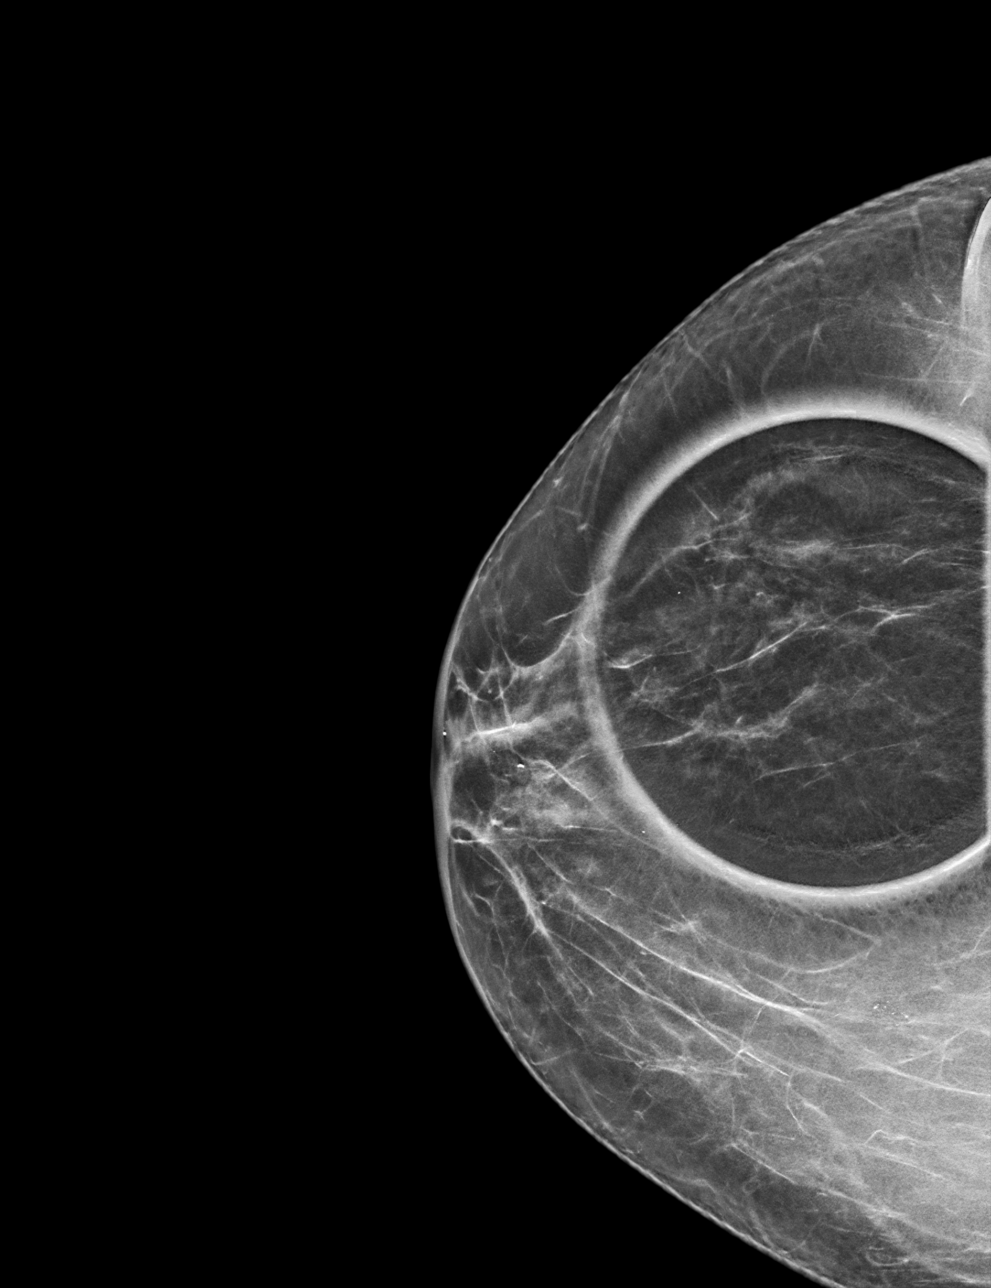

[R CC tomo · tomo slice 27/53.0]
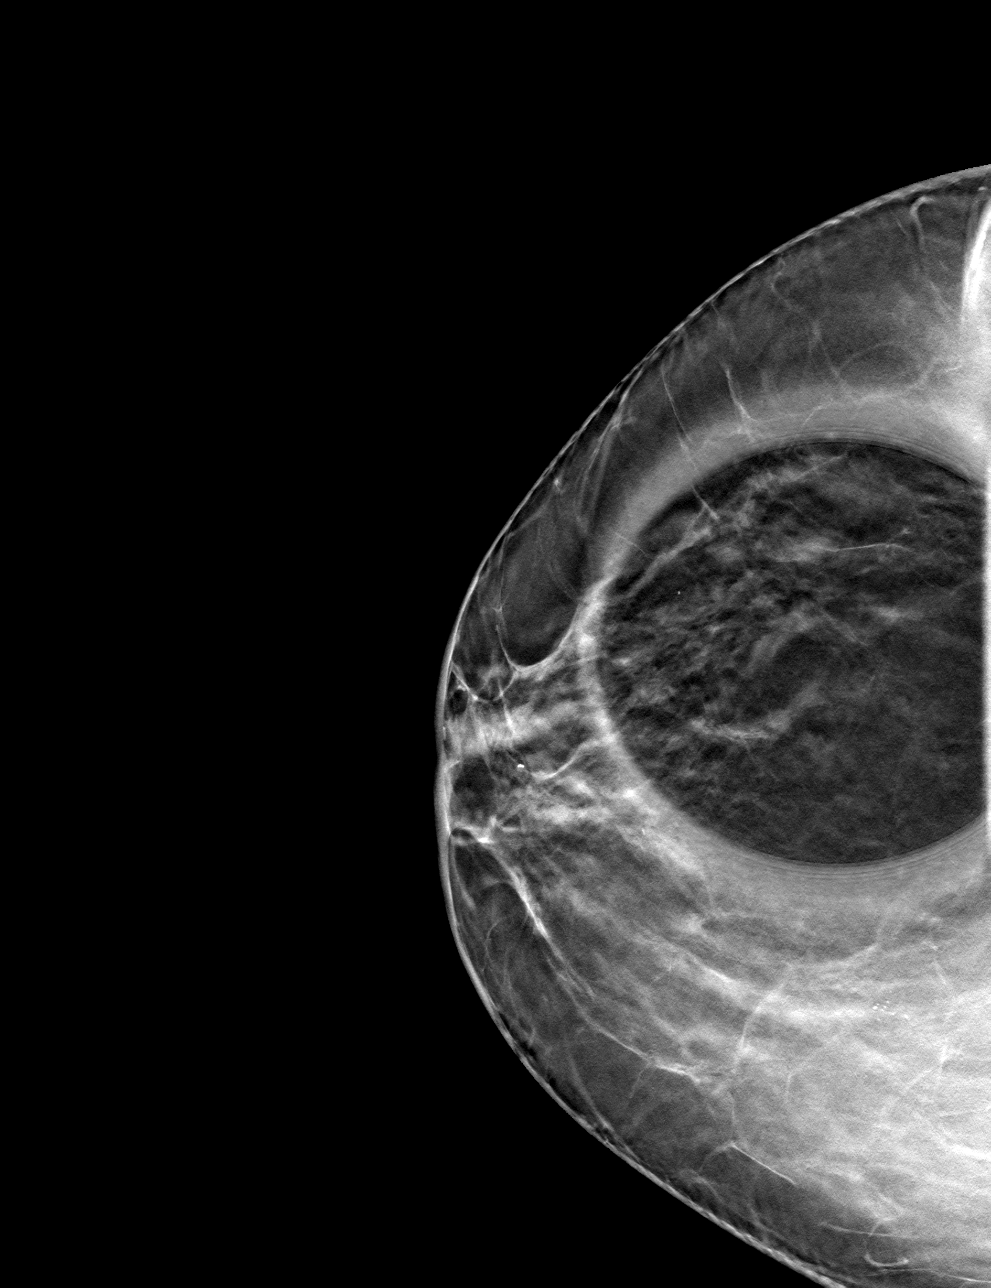

[R MLO tomo · tomo slice 28/55.0]
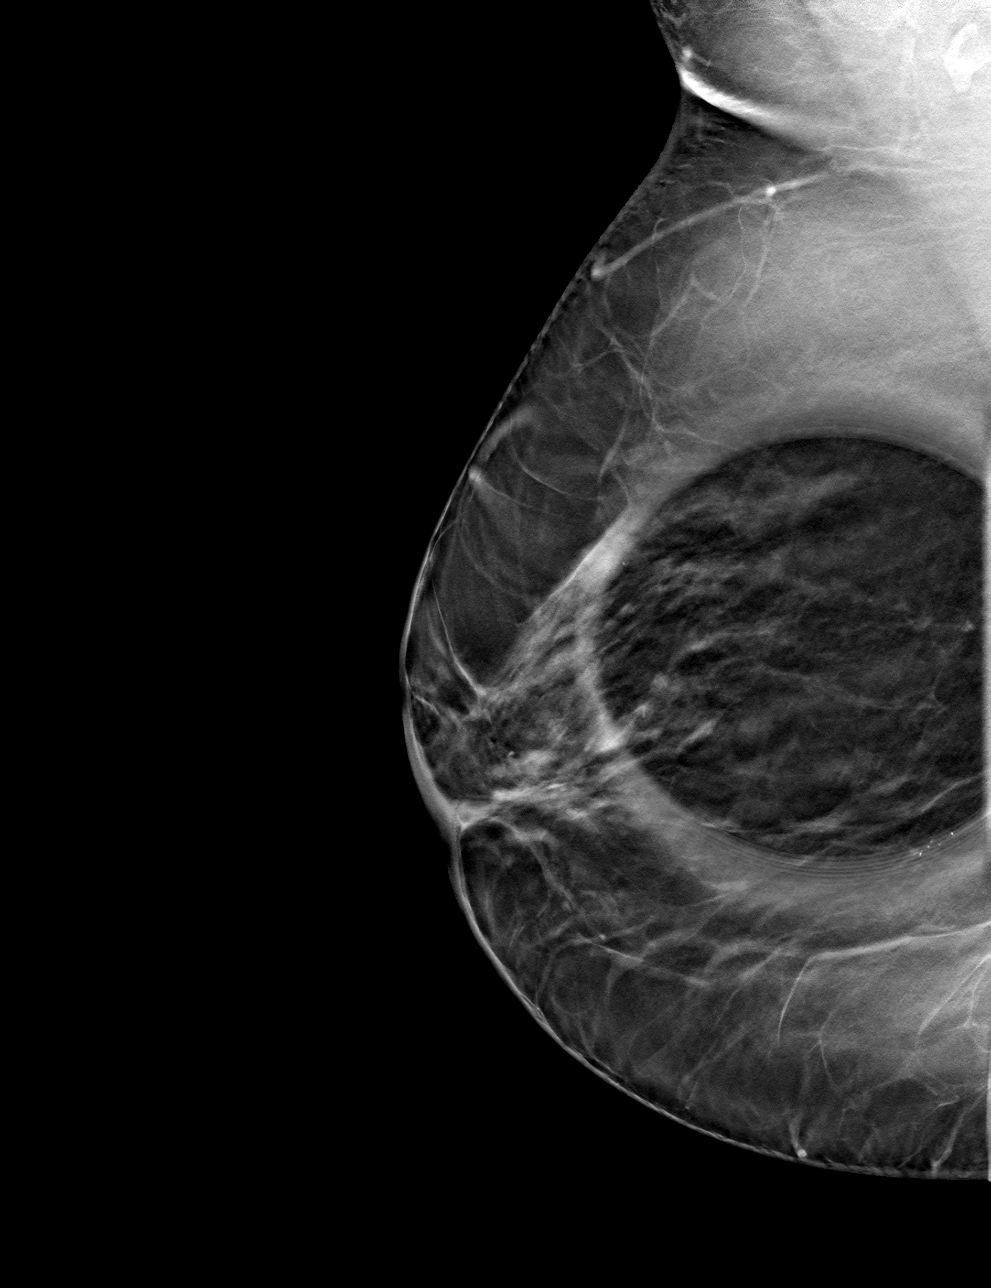

[4 of 12 positions shown; findings below may reference images not displayed]

ACR Breast Density Category b: There are scattered areas of
fibroglandular density.
FINDINGS: An oval, circumscribed equal density mass persists in the central
slightly lateral right breast at middle depth. Further evaluation
with ultrasound was performed.

Mammographic images were processed with CAD.

Targeted ultrasound is performed, showing an oval, circumscribed
nearly anechoic mass at the [DATE] position 5 cm from the nipple. It
measures 6 x 5 x 2 mm. There is no internal vascularity. This likely
corresponds with the screening mammographic finding.

An incidental oval, circumscribed hypoechoic mass is noted at the 9
o'clock position 3 cm from the nipple. It measures 8 x 6 x 2 mm.
There is no internal vascularity.
IMPRESSION: Two probably benign right breast masses at the [DATE] and 9 o'clock
positions. One of these likely corresponds with the screening
mammographic findings. Recommendation is for short-term interval
follow-up.

RECOMMENDATION:
Diagnostic right breast mammogram and ultrasound in 6 months.

I have discussed the findings and recommendations with the patient.
If applicable, a reminder letter will be sent to the patient
regarding the next appointment.

BI-RADS CATEGORY  3: Probably benign.

## 2022-08-23 IMAGING — US US BREAST*R* LIMITED INC AXILLA
1 series · 9 of 9 positions shown · non-contrast
Comparison: Previous exam(s).

CLINICAL DATA: 59-year-old female recalled from screening mammogram
dated 10/26/2019 for a possible right breast mass.

EXAM:
DIGITAL DIAGNOSTIC RIGHT MAMMOGRAM WITH CAD AND TOMO
ULTRASOUND RIGHT BREAST

[Series 1: us breast*right* limited inc axilla · 0.06mm/px · 9 of 9 slices shown]
[im 1/9]
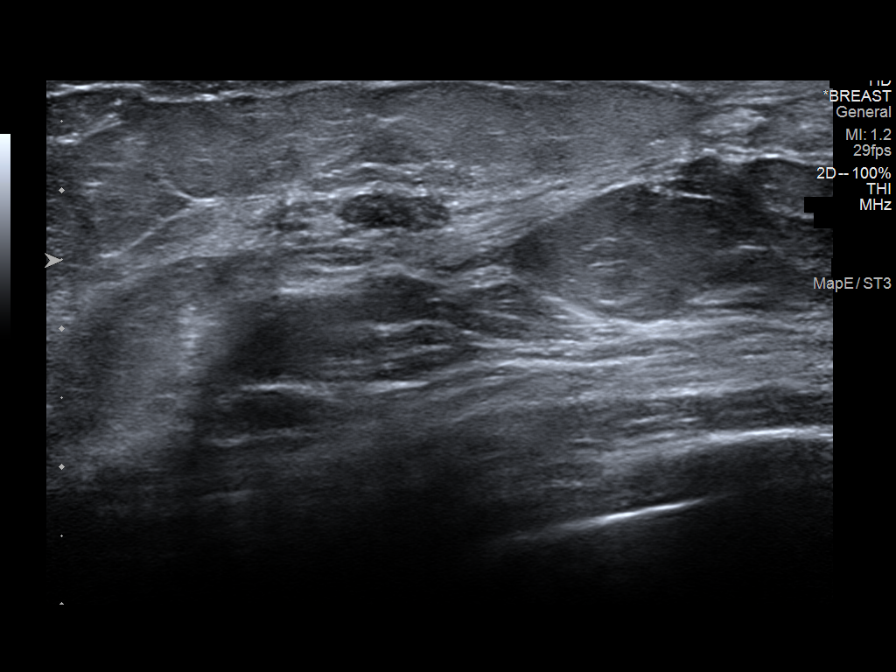
[im 2/9]
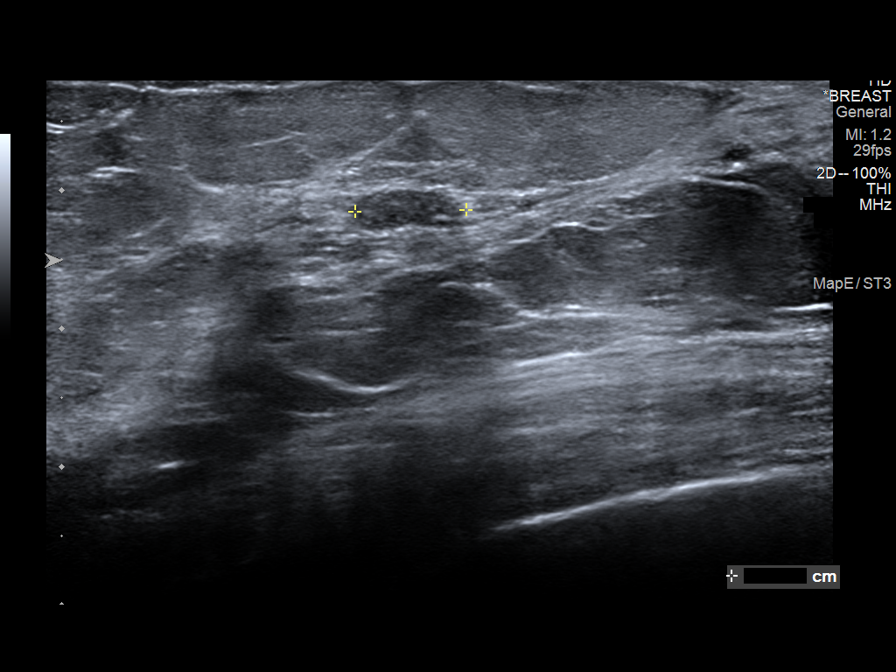
[im 3/9]
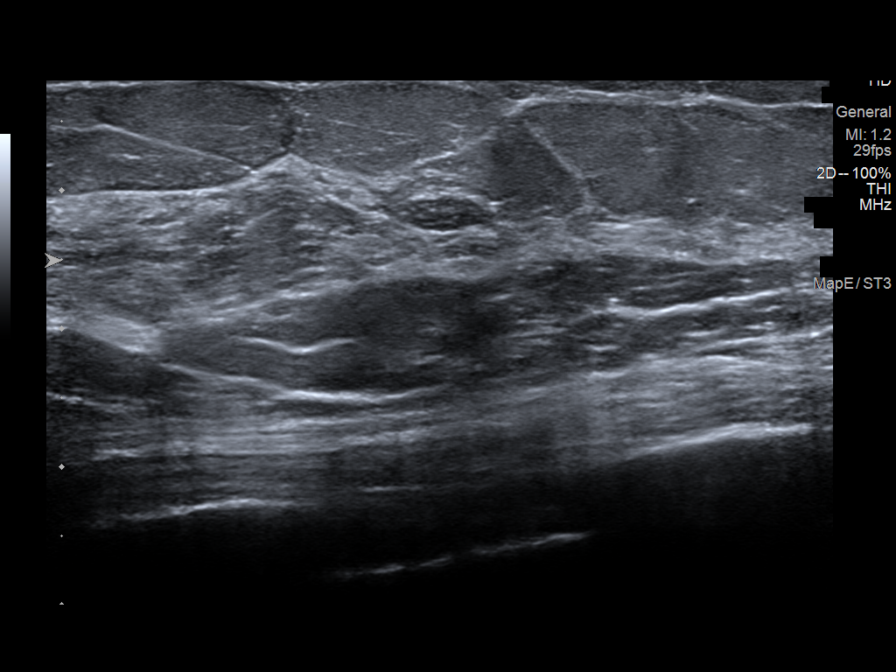
[im 4/9]
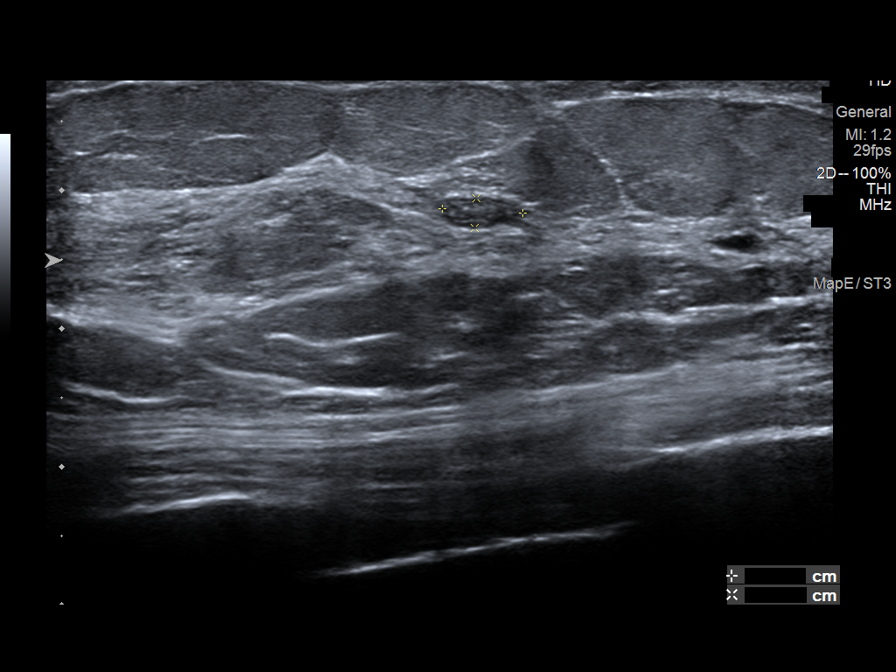
[im 5/9]
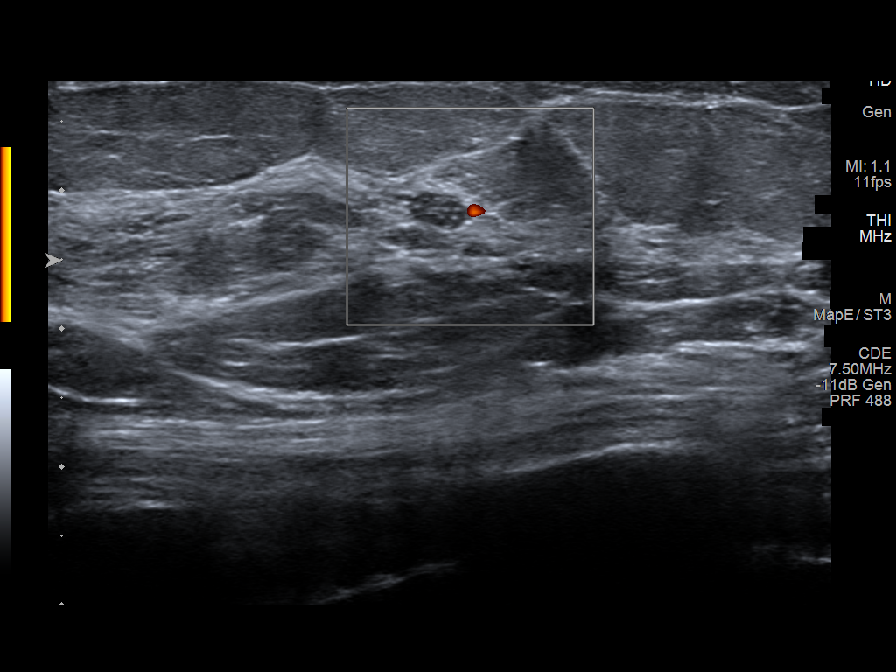
[im 6/9]
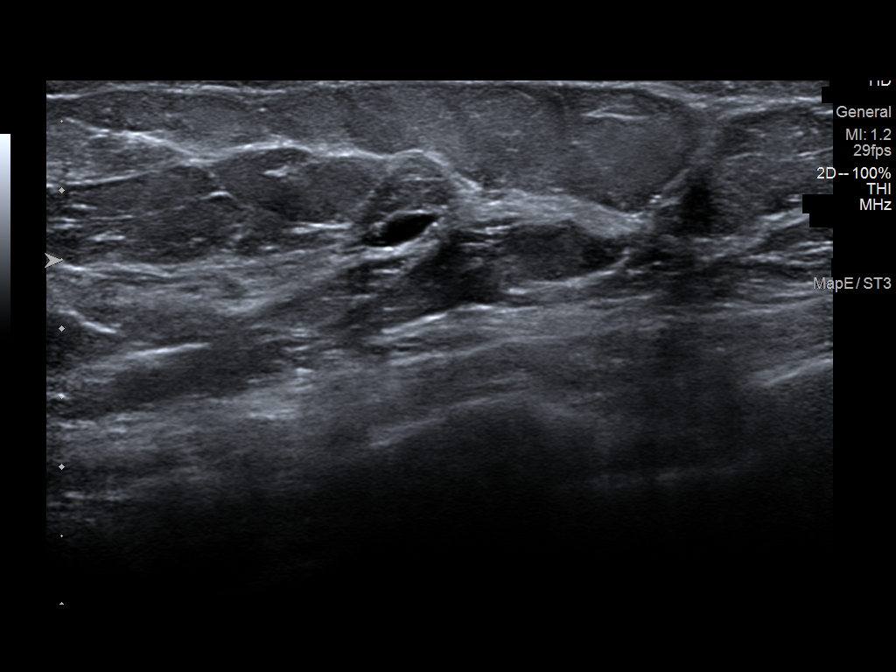
[im 7/9]
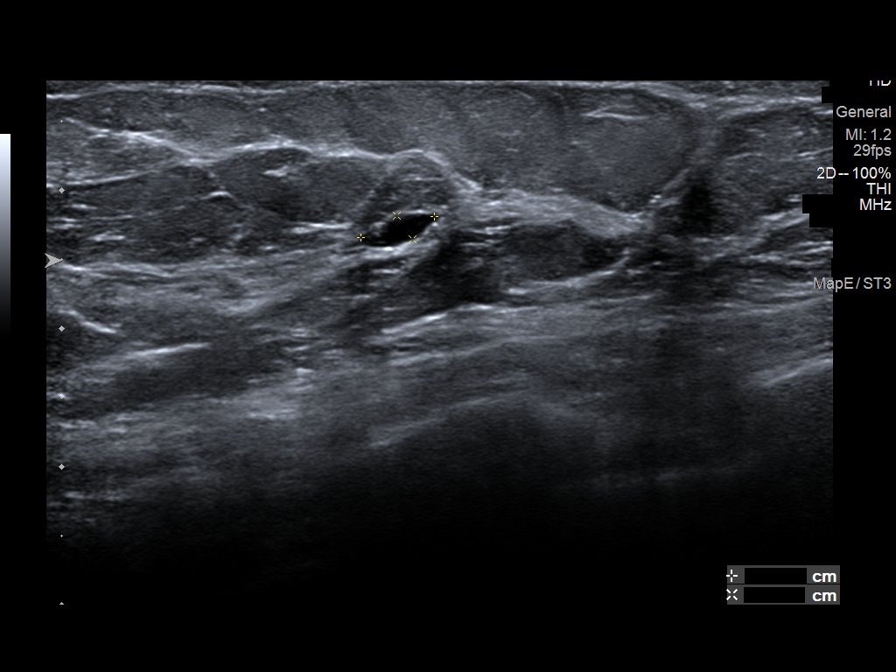
[im 8/9]
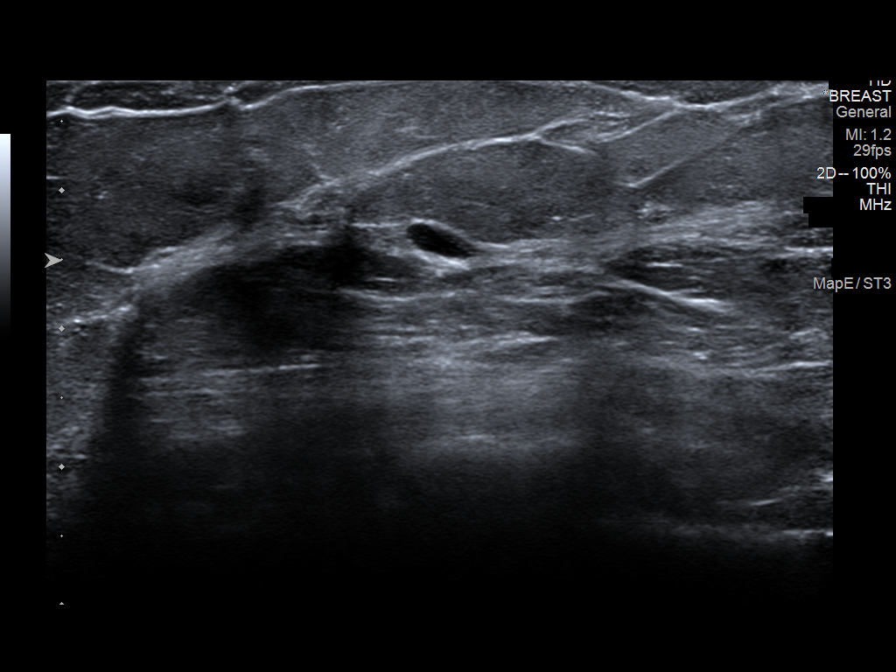
[im 9/9]
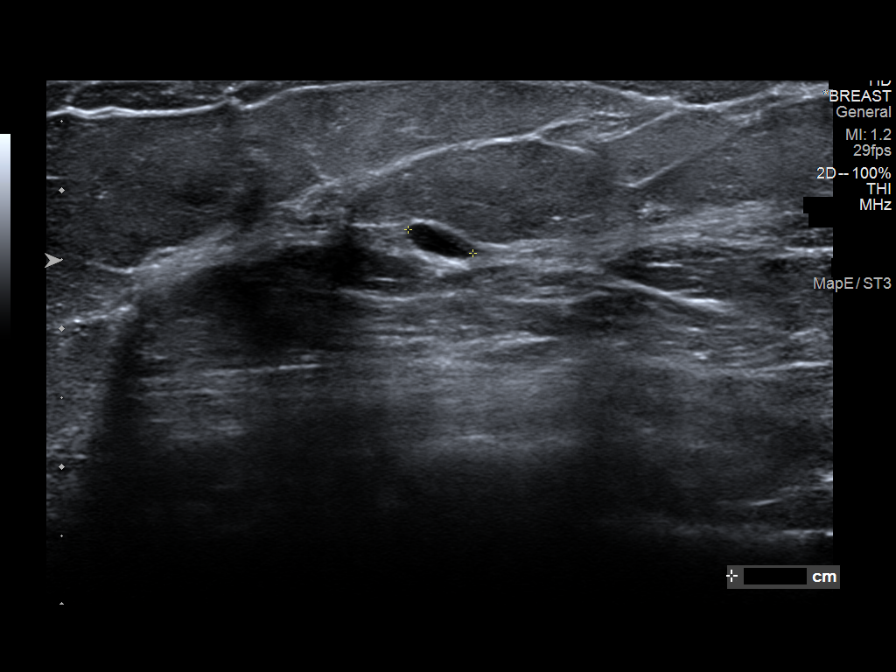

[9 of 9 positions shown; findings below may reference images not displayed]

ACR Breast Density Category b: There are scattered areas of
fibroglandular density.
FINDINGS: An oval, circumscribed equal density mass persists in the central
slightly lateral right breast at middle depth. Further evaluation
with ultrasound was performed.

Mammographic images were processed with CAD.

Targeted ultrasound is performed, showing an oval, circumscribed
nearly anechoic mass at the [DATE] position 5 cm from the nipple. It
measures 6 x 5 x 2 mm. There is no internal vascularity. This likely
corresponds with the screening mammographic finding.

An incidental oval, circumscribed hypoechoic mass is noted at the 9
o'clock position 3 cm from the nipple. It measures 8 x 6 x 2 mm.
There is no internal vascularity.
IMPRESSION: Two probably benign right breast masses at the [DATE] and 9 o'clock
positions. One of these likely corresponds with the screening
mammographic findings. Recommendation is for short-term interval
follow-up.

RECOMMENDATION:
Diagnostic right breast mammogram and ultrasound in 6 months.

I have discussed the findings and recommendations with the patient.
If applicable, a reminder letter will be sent to the patient
regarding the next appointment.

BI-RADS CATEGORY  3: Probably benign.

## 2022-10-22 ENCOUNTER — Other Ambulatory Visit: Payer: Self-pay | Admitting: Nurse Practitioner

## 2022-10-22 DIAGNOSIS — Z1231 Encounter for screening mammogram for malignant neoplasm of breast: Secondary | ICD-10-CM

## 2022-12-05 ENCOUNTER — Ambulatory Visit
Admission: RE | Admit: 2022-12-05 | Discharge: 2022-12-05 | Disposition: A | Discharge status: Home / Self Care | Payer: 59 | Source: Ambulatory Visit | Attending: Nurse Practitioner | Admitting: Nurse Practitioner

## 2022-12-05 DIAGNOSIS — Z1231 Encounter for screening mammogram for malignant neoplasm of breast: Secondary | ICD-10-CM

## 2022-12-15 ENCOUNTER — Emergency Department (HOSPITAL_COMMUNITY)
Admission: EM | Admit: 2022-12-15 | Discharge: 2022-12-15 | Disposition: A | Discharge status: Home / Self Care | Payer: 59 | Attending: Emergency Medicine | Admitting: Emergency Medicine

## 2022-12-15 ENCOUNTER — Other Ambulatory Visit: Payer: Self-pay

## 2022-12-15 ENCOUNTER — Emergency Department (HOSPITAL_COMMUNITY): Payer: 59

## 2022-12-15 DIAGNOSIS — R1010 Upper abdominal pain, unspecified: Secondary | ICD-10-CM | POA: Diagnosis present

## 2022-12-15 DIAGNOSIS — R7401 Elevation of levels of liver transaminase levels: Secondary | ICD-10-CM | POA: Insufficient documentation

## 2022-12-15 DIAGNOSIS — R1013 Epigastric pain: Secondary | ICD-10-CM | POA: Insufficient documentation

## 2022-12-15 LAB — CBC
HCT: 35.6 % — ABNORMAL LOW (ref 36.0–46.0)
Hemoglobin: 11.7 g/dL — ABNORMAL LOW (ref 12.0–15.0)
MCH: 30.5 pg (ref 26.0–34.0)
MCHC: 32.9 g/dL (ref 30.0–36.0)
MCV: 92.7 fL (ref 80.0–100.0)
Platelets: 277 10*3/uL (ref 150–400)
RBC: 3.84 MIL/uL — ABNORMAL LOW (ref 3.87–5.11)
RDW: 12.4 % (ref 11.5–15.5)
WBC: 7.7 10*3/uL (ref 4.0–10.5)
nRBC: 0 % (ref 0.0–0.2)

## 2022-12-15 LAB — COMPREHENSIVE METABOLIC PANEL
ALT: 77 U/L — ABNORMAL HIGH (ref 0–44)
AST: 152 U/L — ABNORMAL HIGH (ref 15–41)
Albumin: 3.5 g/dL (ref 3.5–5.0)
Alkaline Phosphatase: 97 U/L (ref 38–126)
Anion gap: 11 (ref 5–15)
BUN: 15 mg/dL (ref 8–23)
CO2: 25 mmol/L (ref 22–32)
Calcium: 8.9 mg/dL (ref 8.9–10.3)
Chloride: 105 mmol/L (ref 98–111)
Creatinine, Ser: 0.67 mg/dL (ref 0.44–1.00)
GFR, Estimated: >60 mL/min (ref >60)
Glucose, Bld: 140 mg/dL — ABNORMAL HIGH (ref 70–99)
Potassium: 3.9 mmol/L (ref 3.5–5.1)
Sodium: 141 mmol/L (ref 135–145)
Total Bilirubin: 0.4 mg/dL (ref 0.3–1.2)
Total Protein: 6.4 g/dL — ABNORMAL LOW (ref 6.5–8.1)

## 2022-12-15 LAB — URINALYSIS, ROUTINE W REFLEX MICROSCOPIC
Bilirubin Urine: NEGATIVE
Glucose, UA: NEGATIVE mg/dL
Hgb urine dipstick: NEGATIVE
Ketones, ur: NEGATIVE mg/dL
Nitrite: NEGATIVE
Protein, ur: NEGATIVE mg/dL
Specific Gravity, Urine: 1.018 (ref 1.005–1.030)
pH: 6 (ref 5.0–8.0)

## 2022-12-15 LAB — LIPASE, BLOOD: Lipase: 35 U/L (ref 11–51)

## 2022-12-15 LAB — HEPATITIS PANEL, ACUTE
HCV Ab: NONREACTIVE
Hep A IgM: NONREACTIVE
Hep B C IgM: NONREACTIVE
Hepatitis B Surface Ag: NONREACTIVE

## 2022-12-15 MED ORDER — ONDANSETRON HCL 4 MG/2ML IJ SOLN
4.0000 mg | Freq: Once | INTRAMUSCULAR | Status: AC
  Administered 2022-12-15: 4 mg via INTRAVENOUS
  Filled 2022-12-15: qty 2

## 2022-12-15 MED ORDER — HYDROCODONE-ACETAMINOPHEN 5-325 MG PO TABS: 1.0000 | ORAL_TABLET | ORAL | 0 refills | Status: DC | PRN

## 2022-12-15 MED ORDER — ONDANSETRON 4 MG PO TBDP: 4.0000 mg | ORAL_TABLET | Freq: Three times a day (TID) | ORAL | 0 refills | Status: DC | PRN

## 2022-12-15 MED ORDER — IOHEXOL 350 MG/ML SOLN
75.0000 mL | Freq: Once | INTRAVENOUS | Status: AC | PRN
  Administered 2022-12-15: 75 mL via INTRAVENOUS

## 2022-12-15 MED ORDER — SODIUM CHLORIDE 0.9 % IV BOLUS
1000.0000 mL | Freq: Once | INTRAVENOUS | Status: AC
  Administered 2022-12-15: 1000 mL via INTRAVENOUS

## 2022-12-15 MED ORDER — MORPHINE SULFATE (PF) 4 MG/ML IV SOLN
4.0000 mg | Freq: Once | INTRAVENOUS | Status: AC
  Administered 2022-12-15: 4 mg via INTRAVENOUS
  Filled 2022-12-15: qty 1

## 2022-12-15 NOTE — ED Notes (Addendum)
Patient states she recently began taking monjaro, approximately four weeks ago. MD Haviland notified.

## 2022-12-15 NOTE — Discharge Instructions (Addendum)
If pain continues, you may want to consider a HIDA scan which tests the function of your gallbladder.  Your doctor can order that if needed.

## 2022-12-15 NOTE — ED Notes (Signed)
Patient discharged by this RN. Instructions reviewed, patient verbalized understanding with no additional questions. Ambulatory to lobby at discharge with significant other. IV removed by this RN

## 2022-12-15 NOTE — ED Provider Notes (Signed)
[place] [facility]ERGENCY DEPARTMENT AT [facility] Provider Note   CSN: [id number] Arrival date & time: 12/15/22  0503     History  Chief Complaint  Patient presents with   Abdominal Pain    Madeline Richmond is a 62 y.o. female.  Pt is a 62 yo female with pmhx significant for gerd, depression, and anxiety.  Pt woke up this am with with severe upper abd pain.  No n/v.  No fever.  She has never had anything like this in the past.  Pt did start taking Mounjaro about 4 weeks ago.  She was taking Ozempic prior to that.       Home Medications Prior to Admission medications   Medication Sig Start Date End Date Taking? Authorizing Provider  ALPRAZolam Prudy Feeler) 0.5 MG tablet 1/2 tablet 06/26/21   [provider]  FLUoxetine (PROZAC) 20 MG capsule TAKE ONE CAPSULE BY MOUTH DAILY Patient taking differently: Take 20 mg by mouth daily. 04/24/18   [place], Betty G, MD  omeprazole (PRILOSEC) 40 MG capsule Take 1 capsule (40 mg total) by mouth daily for 14 days. 07/29/19 09/24/21  Couture, Cortni S, PA-C      Allergies    Codeine camsylate [codeine]    Review of Systems   Review of Systems  Gastrointestinal:  Positive for abdominal pain.  All other systems reviewed and are negative.   Physical Exam Updated Vital Signs BP 120/65   Pulse 61   Temp 98.2 F (36.8 C) (Oral)   Resp 11   SpO2 96%  Physical Exam Vitals and nursing note reviewed.  Constitutional:      Appearance: She is well-developed. She is obese.  HENT:     Head: Normocephalic and atraumatic.     Mouth/Throat:     Mouth: Mucous membranes are moist.     Pharynx: Oropharynx is clear.  Eyes:     Extraocular Movements: Extraocular movements intact.     Pupils: Pupils are equal, round, and reactive to light.  Cardiovascular:     Rate and Rhythm: Normal rate and regular rhythm.     Heart sounds: Normal heart sounds.  Pulmonary:     Effort: Pulmonary effort is normal.  Abdominal:     General: Abdomen  is flat. Bowel sounds are normal.     Palpations: Abdomen is soft.     Tenderness: There is abdominal tenderness in the epigastric area.  Skin:    General: Skin is warm.     Capillary Refill: Capillary refill takes less than 2 seconds.  Neurological:     General: No focal deficit present.     Mental Status: She is alert and oriented to person, place, and time.  Psychiatric:        Mood and Affect: Mood normal.        Behavior: Behavior normal.     ED Results / Procedures / Treatments   Labs (all labs ordered are listed, but only abnormal results are displayed) Labs Reviewed  COMPREHENSIVE METABOLIC PANEL - Abnormal; Notable for the following components:      Result Value   Glucose, Bld 140 (*)    Total Protein 6.4 (*)    AST 152 (*)    ALT 77 (*)    All other components within normal limits  CBC - Abnormal; Notable for the following components:   RBC 3.84 (*)    Hemoglobin 11.7 (*)    HCT 35.6 (*)    All other components within normal  limits  URINALYSIS, ROUTINE W REFLEX MICROSCOPIC - Abnormal; Notable for the following components:   Leukocytes,Ua MODERATE (*)    Bacteria, UA RARE (*)    All other components within normal limits  LIPASE, BLOOD  HEPATITIS PANEL, ACUTE    EKG EKG Interpretation Date/Time:  Sunday December 15 2022 05:20:28 EDT Ventricular Rate:  70 PR Interval:  144 QRS Duration:  88 QT Interval:  422 QTC Calculation: 455 R Axis:   36  Text Interpretation: No significant change since last tracing Confirmed by Jacalyn Lefevre [phone]) on 12/15/2022 11:20:34 AM  Radiology CT ABDOMEN PELVIS W CONTRAST  Result Date: 12/15/2022 CLINICAL DATA:  Abdominal pain EXAM: CT ABDOMEN AND PELVIS WITH CONTRAST TECHNIQUE: Multidetector CT imaging of the abdomen and pelvis was performed using the standard protocol following bolus administration of intravenous contrast. RADIATION DOSE REDUCTION: This exam was performed according to the departmental dose-optimization  program which includes automated exposure control, adjustment of the mA and/or kV according to patient size and/or use of iterative reconstruction technique. CONTRAST:  75mL OMNIPAQUE IOHEXOL 350 MG/ML SOLN COMPARISON:  CT chest 07/29/2019, CT abdomen 07/29/2019 FINDINGS: Lower chest: Part solid pulmonary nodule in the LEFT lower lobe measures 5 mm on image 14/series 5. Very mild dependent basilar atelectasis. Hepatobiliary: Bilateral large simple fluid attenuation benign hepatic cysts. No biliary duct dilatation. Gallbladder normal. Pancreas: Pancreas is normal. No ductal dilatation. No pancreatic inflammation. Spleen: Normal spleen Adrenals/urinary tract: Adrenal glands and kidneys are normal. The ureters and bladder normal. Stomach/Bowel: Stomach, small bowel, appendix, and cecum are normal. The colon and rectosigmoid colon are normal. Vascular/Lymphatic: Abdominal aorta is normal caliber with atherosclerotic calcification. There is no retroperitoneal or periportal lymphadenopathy. No pelvic lymphadenopathy. Is Reproductive: Uterus and adnexa unremarkable. Other: No free fluid. Musculoskeletal: No aggressive osseous lesion. IMPRESSION: 1. No acute findings in the abdomen pelvis. 2. Normal appendix. 3. Benign hepatic cysts. 4. Left part-solid pulmonary nodule measuring 5 mm. Per Fleischner Society Guidelines, no routine follow-up imaging is recommended. These guidelines do not apply to immunocompromised patients and patients with cancer. Follow up in patients with significant comorbidities as clinically warranted. For lung cancer screening, adhere to Lung-RADS guidelines. Reference: Radiology. 2017; 284(1):228-43. 5.  Aortic Atherosclerosis (ICD10-I70.0). Electronically Signed   By: Genevive Bi M.D.   On: 12/15/2022 11:47    Procedures Procedures    Medications Ordered in ED Medications  sodium chloride 0.9 % bolus 1,000 mL (0 mLs Intravenous Stopped 12/15/22 0853)  morphine (PF) 4 MG/ML injection 4  mg (4 mg Intravenous Given 12/15/22 0812)  ondansetron (ZOFRAN) injection 4 mg (4 mg Intravenous Given 12/15/22 0811)  iohexol (OMNIPAQUE) 350 MG/ML injection 75 mL (75 mLs Intravenous Contrast Given 12/15/22 1105)    ED Course/ Medical Decision Making/ A&P                                 Medical Decision Making Amount and/or Complexity of Data Reviewed Labs: ordered. Radiology: ordered.  Risk Prescription drug management.   This patient presents to the ED for concern of abd pain, this involves an extensive number of treatment options, and is a complaint that carries with it a high risk of complications and morbidity.  The differential diagnosis includes pancreatitis, cholecystitis, gerd, uti   Co morbidities that complicate the patient evaluation  gerd, depression, and anxiety   Additional history obtained:  Additional history obtained from epic chart review External records from outside source  obtained and reviewed including husband   Lab Tests:  I Ordered, and personally interpreted labs.  The pertinent results include:  cmp nl other than ast elevated at 152 and alt elevated at 77 (nl 3 yrs ago); cbc with hgb 11.7 (11.9 3 yrs ago); ua with mod LE and 11-20 wbcs, but nitrite neg   Imaging Studies ordered:  I ordered imaging studies including ct abd/pelvis  I independently visualized and interpreted imaging which showed  No acute findings in the abdomen pelvis.  2. Normal appendix.  3. Benign hepatic cysts.  4. Left part-solid pulmonary nodule measuring 5 mm. Per Fleischner  Society Guidelines, no routine follow-up imaging is recommended.  These guidelines do not apply to immunocompromised patients and  patients with cancer. Follow up in patients with significant  comorbidities as clinically warranted. For lung cancer screening,  adhere to Lung-RADS guidelines. Reference: Radiology. 2017;  284(1):228-43.  5.  Aortic Atherosclerosis (ICD10-I70.0).   I agree with the  radiologist interpretation   Cardiac Monitoring:  The patient was maintained on a cardiac monitor.  I personally viewed and interpreted the cardiac monitored which showed an underlying rhythm of: nsr   Medicines ordered and prescription drug management:  I ordered medication including morphine/zofran  for sx  Reevaluation of the patient after these medicines showed that the patient improved I have reviewed the patients home medicines and have made adjustments as needed   Test Considered:  ct   Critical Interventions:  Pain control   Problem List / ED Course:  Abd pain:  Pt does feel better after meds.  No definite etiology for pain.  LFTs mildly elevated, but gb looks nl.  Liver has some benign cysts.  It could be the [facility], but she's been on it for 4 weeks so it's a little unusual to develop side effects a month after starting.  She has a hx of GERD, but does not feel like the pain is in her chest.  She needs to f/u with GI.  I did order a hepatitis panel prior to d/c.  She knows to return if worse.    Reevaluation:  After the interventions noted above, I reevaluated the patient and found that they have :improved   Social Determinants of Health:  Lives at home   Dispostion:  After consideration of the diagnostic results and the patients response to treatment, I feel that the patent would benefit from discharge with outpatient f/u.          Final Clinical Impression(s) / ED Diagnoses Final diagnoses:  Epigastric pain  Transaminitis    Rx / DC Orders ED Discharge Orders     None         Jacalyn Lefevre, MD 12/15/22 1245

## 2022-12-15 NOTE — ED Triage Notes (Signed)
Patient reports mid abdominal pain this morning , denies emesis or diarrhea , no fever or chills .

## 2022-12-15 NOTE — ED Notes (Signed)
Patient transported to CT 

## 2022-12-25 ENCOUNTER — Other Ambulatory Visit (HOSPITAL_COMMUNITY): Payer: Self-pay | Admitting: Gastroenterology

## 2022-12-25 DIAGNOSIS — R7989 Other specified abnormal findings of blood chemistry: Secondary | ICD-10-CM

## 2022-12-25 DIAGNOSIS — R109 Unspecified abdominal pain: Secondary | ICD-10-CM

## 2022-12-31 ENCOUNTER — Ambulatory Visit (HOSPITAL_COMMUNITY)
Admission: RE | Admit: 2022-12-31 | Discharge: 2022-12-31 | Disposition: A | Discharge status: Home / Self Care | Payer: 59 | Source: Ambulatory Visit | Attending: Gastroenterology | Admitting: Gastroenterology

## 2022-12-31 DIAGNOSIS — R109 Unspecified abdominal pain: Secondary | ICD-10-CM | POA: Insufficient documentation

## 2022-12-31 DIAGNOSIS — R7989 Other specified abnormal findings of blood chemistry: Secondary | ICD-10-CM | POA: Diagnosis present

## 2022-12-31 MED ORDER — TECHNETIUM TC 99M MEBROFENIN IV KIT
5.0000 | PACK | Freq: Once | INTRAVENOUS | Status: AC | PRN
  Administered 2022-12-31: 5 via INTRAVENOUS

## 2023-01-14 ENCOUNTER — Other Ambulatory Visit: Payer: Self-pay | Admitting: Surgery

## 2023-01-14 DIAGNOSIS — R1013 Epigastric pain: Secondary | ICD-10-CM

## 2023-01-16 ENCOUNTER — Ambulatory Visit
Admission: RE | Admit: 2023-01-16 | Discharge: 2023-01-16 | Disposition: A | Discharge status: Home / Self Care | Payer: 59 | Source: Ambulatory Visit | Attending: Surgery | Admitting: Surgery

## 2023-01-16 DIAGNOSIS — R1013 Epigastric pain: Secondary | ICD-10-CM

## 2023-04-04 ENCOUNTER — Other Ambulatory Visit: Payer: Self-pay | Admitting: Internal Medicine

## 2023-04-04 DIAGNOSIS — N6312 Unspecified lump in the right breast, upper inner quadrant: Secondary | ICD-10-CM

## 2023-04-08 ENCOUNTER — Ambulatory Visit
Admission: RE | Admit: 2023-04-08 | Discharge: 2023-04-08 | Disposition: A | Discharge status: Home / Self Care | Payer: 59 | Source: Ambulatory Visit | Attending: Internal Medicine | Admitting: Internal Medicine

## 2023-04-08 ENCOUNTER — Ambulatory Visit
Admission: RE | Admit: 2023-04-08 | Discharge: 2023-04-08 | Discharge status: Home / Self Care | Payer: 59 | Source: Ambulatory Visit | Attending: Internal Medicine

## 2023-04-08 DIAGNOSIS — N6312 Unspecified lump in the right breast, upper inner quadrant: Secondary | ICD-10-CM

## 2023-04-24 ENCOUNTER — Other Ambulatory Visit: Payer: Self-pay | Admitting: Surgery

## 2023-04-24 DIAGNOSIS — K8020 Calculus of gallbladder without cholecystitis without obstruction: Secondary | ICD-10-CM

## 2023-05-02 ENCOUNTER — Encounter (HOSPITAL_COMMUNITY): Payer: Self-pay

## 2023-05-02 NOTE — Pre-Procedure Instructions (Signed)
Surgical Instructions   Your procedure is scheduled on May 09, 2023. Report to [facility] Main Entrance "A" at 5:30 A.M., then check in with the Admitting office. Any questions or running late day of surgery: call [phone]  Questions prior to your surgery date: call [phone], Monday-Friday, 8am-4pm. If you experience any cold or flu symptoms such as cough, fever, chills, shortness of breath, etc. between now and your scheduled surgery, please notify us at the above number.     Remember:  Do not eat after midnight the night before your surgery   You may drink clear liquids until 4:30 AM the morning of your surgery.   Clear liquids allowed are: Water, Non-Citrus Juices (without pulp), Carbonated Beverages, Clear Tea (no milk, honey, etc.), Black Coffee Only (NO MILK, CREAM OR POWDERED CREAMER of any kind), and Gatorade.    Take these medicines the morning of surgery with A SIP OF WATER: ALPRAZolam (XANAX)  FLUoxetine (PROZAC)  loratadine (CLARITIN)  omeprazole (PRILOSEC)    May take these medicines IF NEEDED: oxymetazoline (AFRIN) nasal spray    STOP taking your Semaglutide one week prior to surgery. DO NOT take any doses after February 13th.   One week prior to surgery, STOP taking any Aspirin (unless otherwise instructed by your surgeon) Aleve, Naproxen, Ibuprofen, Motrin, Advil, Goody's, BC's, all herbal medications, fish oil, and non-prescription vitamins.                     Do NOT Smoke (Tobacco/Vaping) for 24 hours prior to your procedure.  If you use a CPAP at night, you may bring your mask/headgear for your overnight stay.   You will be asked to remove any contacts, glasses, piercing's, hearing aid's, dentures/partials prior to surgery. Please bring cases for these items if needed.    Patients discharged the day of surgery will not be allowed to drive home, and someone needs to stay with them for 24 hours.  SURGICAL WAITING ROOM VISITATION Patients may  have no more than 2 support people in the waiting area - these visitors may rotate.   Pre-op nurse will coordinate an appropriate time for 1 ADULT support person, who may not rotate, to accompany patient in pre-op.  Children under the age of 39 must have an adult with them who is not the patient and must remain in the main waiting area with an adult.  If the patient needs to stay at the hospital during part of their recovery, the visitor guidelines for inpatient rooms apply.  Please refer to the [facility] website for the visitor guidelines for any additional information.   If you received a COVID test during your pre-op visit  it is requested that you wear a mask when out in public, stay away from anyone that may not be feeling well and notify your surgeon if you develop symptoms. If you have been in contact with anyone that has tested positive in the last 10 days please notify you surgeon.      Pre-operative CHG Bathing Instructions   You can play a key role in reducing the risk of infection after surgery. Your skin needs to be as free of germs as possible. You can reduce the number of germs on your skin by washing with CHG (chlorhexidine gluconate) soap before surgery. CHG is an antiseptic soap that kills germs and continues to kill germs even after washing.   DO NOT use if you have an allergy to chlorhexidine/CHG or antibacterial soaps. If  your skin becomes reddened or irritated, stop using the CHG and notify one of our RNs at [phone].              TAKE A SHOWER THE NIGHT BEFORE SURGERY AND THE DAY OF SURGERY    Please keep in mind the following:  DO NOT shave, including legs and underarms, 48 hours prior to surgery.   You may shave your face before/day of surgery.  Place clean sheets on your bed the night before surgery Use a clean washcloth (not used since being washed) for each shower. DO NOT sleep with pet's night before surgery.  CHG Shower Instructions:  Wash your face  and private area with normal soap. If you choose to wash your hair, wash first with your normal shampoo.  After you use shampoo/soap, rinse your hair and body thoroughly to remove shampoo/soap residue.  Turn the water OFF and apply half the bottle of CHG soap to a CLEAN washcloth.  Apply CHG soap ONLY FROM YOUR NECK DOWN TO YOUR TOES (washing for 3-5 minutes)  DO NOT use CHG soap on face, private areas, open wounds, or sores.  Pay special attention to the area where your surgery is being performed.  If you are having back surgery, having someone wash your back for you may be helpful. Wait 2 minutes after CHG soap is applied, then you may rinse off the CHG soap.  Pat dry with a clean towel  Put on clean pajamas    Additional instructions for the day of surgery: DO NOT APPLY any lotions, deodorants, cologne, or perfumes.   Do not wear jewelry or makeup Do not wear nail polish, gel polish, artificial nails, or any other type of covering on natural nails (fingers and toes) Do not bring valuables to the hospital. [facility] is not responsible for valuables/personal belongings. Put on clean/comfortable clothes.  Please brush your teeth.  Ask your nurse before applying any prescription medications to the skin.

## 2023-05-02 NOTE — Progress Notes (Signed)
PCP - Dr Guadalupe Maple Cardiologist - none  Chest x-ray - n/a EKG - 12/15/22 Stress Test - n/a ECHO - n/a Cardiac Cath - n/a  ICD Pacemaker/Loop - n/a  Sleep Study -  n/a  Diabetes - n/a  Aspirin and Blood Thinner Instructions:  n/a  ERAS - clear liquids til 4:30 AM DOS. PRE-SURGERY Ensure given at PAT appt with instructions.   Anesthesia review: no  STOP now taking any Aspirin (unless otherwise instructed by your surgeon), Aleve, Naproxen, Ibuprofen, Motrin, Advil, Goody's, BC's, all herbal medications, fish oil, and all vitamins.   Coronavirus Screening Do you have any of the following symptoms:  Cough occasional cough Fever (>100.57F)  yes/no: No Runny nose yes/no: No Sore throat yes/no: No Difficulty breathing/shortness of breath  yes/no: No  Have you traveled in the last 14 days and where? Yes, [place] 04/15/23 - 04/22/23.  Patient verbalized understanding of instructions that were given to them at the PAT appointment. Patient was also instructed that they will need to review over the PAT instructions again at home before surgery.

## 2023-05-05 ENCOUNTER — Encounter (HOSPITAL_COMMUNITY): Payer: Self-pay

## 2023-05-05 ENCOUNTER — Encounter (HOSPITAL_COMMUNITY)
Admission: RE | Admit: 2023-05-05 | Discharge: 2023-05-05 | Disposition: A | Discharge status: Home / Self Care | Payer: 59 | Source: Ambulatory Visit | Attending: Surgery | Admitting: Surgery

## 2023-05-05 ENCOUNTER — Other Ambulatory Visit: Payer: Self-pay

## 2023-05-05 VITALS — BP 150/81 | HR 77 | Temp 98.0°F | Resp 17 | Ht 67.0 in | Wt 164.9 lb

## 2023-05-05 DIAGNOSIS — Z01812 Encounter for preprocedural laboratory examination: Secondary | ICD-10-CM | POA: Insufficient documentation

## 2023-05-05 DIAGNOSIS — K8020 Calculus of gallbladder without cholecystitis without obstruction: Secondary | ICD-10-CM | POA: Diagnosis not present

## 2023-05-05 DIAGNOSIS — Z01818 Encounter for other preprocedural examination: Secondary | ICD-10-CM | POA: Diagnosis present

## 2023-05-05 HISTORY — DX: Allergy status to unspecified drugs, medicaments and biological substances: Z88.9

## 2023-05-05 HISTORY — DX: Unspecified osteoarthritis, unspecified site: M19.90

## 2023-05-05 LAB — CBC
HCT: 40.6 % (ref 36.0–46.0)
Hemoglobin: 13.3 g/dL (ref 12.0–15.0)
MCH: 29.4 pg (ref 26.0–34.0)
MCHC: 32.8 g/dL (ref 30.0–36.0)
MCV: 89.6 fL (ref 80.0–100.0)
Platelets: 316 10*3/uL (ref 150–400)
RBC: 4.53 MIL/uL (ref 3.87–5.11)
RDW: 12.4 % (ref 11.5–15.5)
WBC: 6.4 10*3/uL (ref 4.0–10.5)
nRBC: 0 % (ref 0.0–0.2)

## 2023-05-05 LAB — COMPREHENSIVE METABOLIC PANEL
ALT: 17 U/L (ref 0–44)
AST: 17 U/L (ref 15–41)
Albumin: 4 g/dL (ref 3.5–5.0)
Alkaline Phosphatase: 66 U/L (ref 38–126)
Anion gap: 9 (ref 5–15)
BUN: 13 mg/dL (ref 8–23)
CO2: 26 mmol/L (ref 22–32)
Calcium: 9.5 mg/dL (ref 8.9–10.3)
Chloride: 105 mmol/L (ref 98–111)
Creatinine, Ser: 0.96 mg/dL (ref 0.44–1.00)
GFR, Estimated: >60 mL/min (ref >60)
Glucose, Bld: 92 mg/dL (ref 70–99)
Potassium: 3.8 mmol/L (ref 3.5–5.1)
Sodium: 140 mmol/L (ref 135–145)
Total Bilirubin: 0.6 mg/dL (ref 0.0–1.2)
Total Protein: 7.2 g/dL (ref 6.5–8.1)

## 2023-05-05 NOTE — Pre-Procedure Instructions (Signed)
Surgical Instructions   Your procedure is scheduled on May 09, 2023. Report to [facility] Main Entrance "A" at 5:30 A.M., then check in with the Admitting office. Any questions or running late day of surgery: call [phone]  Questions prior to your surgery date: call [phone], Monday-Friday, 8am-4pm. If you experience any cold or flu symptoms such as cough, fever, chills, shortness of breath, etc. between now and your scheduled surgery, please notify us at the above number.     Remember:  Do not eat after midnight the night before your surgery   You may drink clear liquids until 4:30 AM the morning of your surgery.   Clear liquids allowed are: Water, Non-Citrus Juices (without pulp), Carbonated Beverages, Clear Tea (no milk, honey, etc.), Black Coffee Only (NO MILK, CREAM OR POWDERED CREAMER of any kind), and Gatorade.  Please complete your PRE-SURGERY ENSURE that was provided to you by 4:30 AM, the morning of surgery.  Please, if able, drink it in one setting. DO NOT SIP.  This will be the last thing that you will drink on the morning of surgery.    Take these medicines the morning of surgery with A SIP OF WATER: ALPRAZolam (XANAX)  FLUoxetine (PROZAC)  loratadine (CLARITIN)  omeprazole (PRILOSEC)    May take these medicines IF NEEDED: oxymetazoline (AFRIN) nasal spray    STOP taking your Semaglutide one week prior to surgery. DO NOT take any doses after February 13th.   One week prior to surgery, STOP taking any Aspirin (unless otherwise instructed by your surgeon) Aleve, Naproxen, Ibuprofen, Motrin, Advil, Goody's, BC's, all herbal medications, fish oil, and non-prescription vitamins.                     Do NOT Smoke (Tobacco/Vaping) for 24 hours prior to your procedure.  If you use a CPAP at night, you may bring your mask/headgear for your overnight stay.   You will be asked to remove any contacts, glasses, piercing's, hearing aid's, dentures/partials prior  to surgery. Please bring cases for these items if needed.    Patients discharged the day of surgery will not be allowed to drive home, and someone needs to stay with them for 24 hours.  SURGICAL WAITING ROOM VISITATION Patients may have no more than 2 support people in the waiting area - these visitors may rotate.   Pre-op nurse will coordinate an appropriate time for 1 ADULT support person, who may not rotate, to accompany patient in pre-op.  Children under the age of 66 must have an adult with them who is not the patient and must remain in the main waiting area with an adult.  If the patient needs to stay at the hospital during part of their recovery, the visitor guidelines for inpatient rooms apply.  Please refer to the [facility] website for the visitor guidelines for any additional information.   If you received a COVID test during your pre-op visit  it is requested that you wear a mask when out in public, stay away from anyone that may not be feeling well and notify your surgeon if you develop symptoms. If you have been in contact with anyone that has tested positive in the last 10 days please notify you surgeon.      Pre-operative CHG Bathing Instructions   You can play a key role in reducing the risk of infection after surgery. Your skin needs to be as free of germs as possible. You can reduce the number  of germs on your skin by washing with CHG (chlorhexidine gluconate) soap before surgery. CHG is an antiseptic soap that kills germs and continues to kill germs even after washing.   DO NOT use if you have an allergy to chlorhexidine/CHG or antibacterial soaps. If your skin becomes reddened or irritated, stop using the CHG and notify one of our RNs at [phone].              TAKE A SHOWER THE NIGHT BEFORE SURGERY AND THE DAY OF SURGERY    Please keep in mind the following:  DO NOT shave, including legs and underarms, 48 hours prior to surgery.   You may shave your face  before/day of surgery.  Place clean sheets on your bed the night before surgery Use a clean washcloth (not used since being washed) for each shower. DO NOT sleep with pet's night before surgery.  CHG Shower Instructions:  Wash your face and private area with normal soap. If you choose to wash your hair, wash first with your normal shampoo.  After you use shampoo/soap, rinse your hair and body thoroughly to remove shampoo/soap residue.  Turn the water OFF and apply half the bottle of CHG soap to a CLEAN washcloth.  Apply CHG soap ONLY FROM YOUR NECK DOWN TO YOUR TOES (washing for 3-5 minutes)  DO NOT use CHG soap on face, private areas, open wounds, or sores.  Pay special attention to the area where your surgery is being performed.  If you are having back surgery, having someone wash your back for you may be helpful. Wait 2 minutes after CHG soap is applied, then you may rinse off the CHG soap.  Pat dry with a clean towel  Put on clean pajamas    Additional instructions for the day of surgery: DO NOT APPLY any lotions, deodorants, cologne, or perfumes.   Do not wear jewelry or makeup Do not wear nail polish, gel polish, artificial nails, or any other type of covering on natural nails (fingers and toes) Do not bring valuables to the hospital. [facility] is not responsible for valuables/personal belongings. Put on clean/comfortable clothes.  Please brush your teeth.  Ask your nurse before applying any prescription medications to the skin.

## 2023-05-08 NOTE — H&P (Signed)
PROVIDER: Wayne Both, MD  MRN: [id number] DOB: 1960-07-27 DATE OF ENCOUNTER: 04/24/2023 Subjective   Chief Complaint: NEW PROBLEM (LTF Chronic cholecystitis Pt his having gallbladder attacks)   History of Present Illness: Madeline Richmond is a 63 y.o. female who is seen today for biliary colic. She was seen by Dr. Luisa Hart late last year. She had been referred for biliary dyskinesia. She had a HIDA scan showing a 9% gallbladder ejection fraction. An ultrasound was ordered but she had not heard from the results. The ultrasound actually showed gallstones. She had known cyst in her liver but it also demonstrated a possible 5 cm mass in the middle of the cysts. She is still been having attacks of epigastric and right upper quadrant abdominal pain and recently had an episode of nausea and vomiting after fatty meal when she was out of the country. She currently denies jaundice. She is otherwise without complaints.    Review of Systems: A complete review of systems was obtained from the patient. I have reviewed this information and discussed as appropriate with the patient. See HPI as well for other ROS.  ROS   Medical History: Past Medical History:  Diagnosis Date  Anxiety  GERD (gastroesophageal reflux disease)   There is no problem list on file for this patient.  Past Surgical History:  Procedure Laterality Date  LENS EYE SURGERY    Allergies  Allergen Reactions  Codeine Nausea   Current Outpatient Medications on File Prior to Visit  Medication Sig Dispense Refill  betamethasone dipropionate, augmented, (DIPROLENE-AF) 0.05 % cream  FLUoxetine (PROZAC) 40 MG capsule  omeprazole (PRILOSEC OTC) 20 MG EC tablet Take 20 mg by mouth once daily  ondansetron (ZOFRAN-ODT) 4 MG disintegrating tablet  tacrolimus (PROTOPIC) 0.03 % ointment Apply topically 2 (two) times daily  tirzepatide (MOUNJARO) 2.5 mg/0.5 mL pen injector Inject 2.5 mg subcutaneously once a week   No current  facility-administered medications on file prior to visit.   Family History  Problem Relation Age of Onset  High blood pressure (Hypertension) Mother  Hyperlipidemia (Elevated cholesterol) Mother  Diabetes Father  High blood pressure (Hypertension) Father  Skin cancer Father  Obesity Father  Breast cancer Sister  High blood pressure (Hypertension) Brother    Social History   Tobacco Use  Smoking Status Never  Smokeless Tobacco Never    Social History   Socioeconomic History  Marital status: Married  Tobacco Use  Smoking status: Never  Smokeless tobacco: Never  Vaping Use  Vaping status: Never Used  Substance and Sexual Activity  Alcohol use: Yes  Drug use: Never   Social Drivers of Health   Housing Stability: Unknown (04/24/2023)  Housing Stability Vital Sign  Homeless in the Last Year: No   Objective:   Vitals:  04/24/23 1138  BP: 132/87  Pulse: 69  Temp: 36.4 C (97.5 F)  SpO2: 97%  Weight: 75 kg (165 lb 6.4 oz)  Height: 170.2 cm (5\' 7" )  PainSc: 2   Body mass index is 25.91 kg/m.  Physical Exam   She appears well today.  Her abdomen is soft and nontender.  There is no hepatomegaly  Labs, Imaging and Diagnostic Testing:  I have reviewed her most recent CT scan, ultrasound, and HIDA scan  Assessment and Plan:   Symptomatic cholelithiasis  The ultrasound does show gallstones and her EF is 9%. I discussed the finding of the ultrasound and that MRI was indicated but she would like to hold on this as she is getting  ready to travel out of the country again and wants to go ahead and proceed with a cholecystectomy first. As she is quite symptomatic, I agree that we can go ahead and proceed with a laparoscopic cholecystectomy. I discussed the procedure in detail. I gave her literature regarding this. I discussed the risk which includes but is not limited to bleeding, infection, injury to surrounding structures, the need to convert to an open procedure,  bile duct injury, bile leak, cardiopulmonary issues with anesthesia, etc. She understands and wished to proceed with surgery which will be scheduled urgently

## 2023-05-09 ENCOUNTER — Encounter (HOSPITAL_COMMUNITY)
Admission: RE | Disposition: A | Discharge status: Home / Self Care | Payer: Self-pay | Source: Ambulatory Visit | Attending: Surgery

## 2023-05-09 ENCOUNTER — Ambulatory Visit (HOSPITAL_COMMUNITY)
Admission: RE | Admit: 2023-05-09 | Discharge: 2023-05-09 | Disposition: A | Discharge status: Home / Self Care | Payer: 59 | Source: Ambulatory Visit | Attending: Surgery | Admitting: Surgery

## 2023-05-09 ENCOUNTER — Ambulatory Visit (HOSPITAL_COMMUNITY): Payer: 59

## 2023-05-09 ENCOUNTER — Other Ambulatory Visit: Payer: Self-pay

## 2023-05-09 ENCOUNTER — Encounter (HOSPITAL_COMMUNITY): Payer: Self-pay | Admitting: Surgery

## 2023-05-09 ENCOUNTER — Ambulatory Visit (HOSPITAL_BASED_OUTPATIENT_CLINIC_OR_DEPARTMENT_OTHER): Payer: 59

## 2023-05-09 DIAGNOSIS — K828 Other specified diseases of gallbladder: Secondary | ICD-10-CM | POA: Insufficient documentation

## 2023-05-09 DIAGNOSIS — K219 Gastro-esophageal reflux disease without esophagitis: Secondary | ICD-10-CM | POA: Insufficient documentation

## 2023-05-09 DIAGNOSIS — Z79899 Other long term (current) drug therapy: Secondary | ICD-10-CM | POA: Diagnosis not present

## 2023-05-09 DIAGNOSIS — K8010 Calculus of gallbladder with chronic cholecystitis without obstruction: Secondary | ICD-10-CM | POA: Insufficient documentation

## 2023-05-09 DIAGNOSIS — K811 Chronic cholecystitis: Secondary | ICD-10-CM | POA: Diagnosis present

## 2023-05-09 DIAGNOSIS — F32A Depression, unspecified: Secondary | ICD-10-CM | POA: Diagnosis not present

## 2023-05-09 DIAGNOSIS — M1990 Unspecified osteoarthritis, unspecified site: Secondary | ICD-10-CM | POA: Diagnosis not present

## 2023-05-09 DIAGNOSIS — F419 Anxiety disorder, unspecified: Secondary | ICD-10-CM | POA: Insufficient documentation

## 2023-05-09 HISTORY — PX: CHOLECYSTECTOMY: SHX55

## 2023-05-09 SURGERY — LAPAROSCOPIC CHOLECYSTECTOMY
Anesthesia: General

## 2023-05-09 MED ORDER — ONDANSETRON HCL 4 MG/2ML IJ SOLN
INTRAMUSCULAR | Status: DC | PRN
  Administered 2023-05-09: 4 mg via INTRAVENOUS

## 2023-05-09 MED ORDER — BUPIVACAINE-EPINEPHRINE 0.25% -1:200000 IJ SOLN
INTRAMUSCULAR | Status: DC | PRN
  Administered 2023-05-09: 20 mL

## 2023-05-09 MED ORDER — MIDAZOLAM HCL 2 MG/2ML IJ SOLN
INTRAMUSCULAR | Status: DC | PRN
  Administered 2023-05-09: 2 mg via INTRAVENOUS

## 2023-05-09 MED ORDER — ENSURE PRE-SURGERY PO LIQD: 296.0000 mL | Freq: Once | ORAL | Status: DC

## 2023-05-09 MED ORDER — SUGAMMADEX SODIUM 200 MG/2ML IV SOLN
INTRAVENOUS | Status: AC
  Filled 2023-05-09: qty 2

## 2023-05-09 MED ORDER — CHLORHEXIDINE GLUCONATE CLOTH 2 % EX PADS: 6.0000 | MEDICATED_PAD | Freq: Once | CUTANEOUS | Status: DC

## 2023-05-09 MED ORDER — HYDROMORPHONE HCL 1 MG/ML IJ SOLN
INTRAMUSCULAR | Status: AC
  Filled 2023-05-09: qty 1

## 2023-05-09 MED ORDER — ACETAMINOPHEN 500 MG PO TABS
1000.0000 mg | ORAL_TABLET | ORAL | Status: AC
  Administered 2023-05-09: 1000 mg via ORAL
  Filled 2023-05-09: qty 2

## 2023-05-09 MED ORDER — ORAL CARE MOUTH RINSE: 15.0000 mL | Freq: Once | OROMUCOSAL | Status: AC

## 2023-05-09 MED ORDER — CHLORHEXIDINE GLUCONATE 0.12 % MT SOLN
15.0000 mL | Freq: Once | OROMUCOSAL | Status: AC
  Administered 2023-05-09: 15 mL via OROMUCOSAL
  Filled 2023-05-09: qty 15

## 2023-05-09 MED ORDER — OXYCODONE HCL 5 MG PO TABS
5.0000 mg | ORAL_TABLET | Freq: Once | ORAL | Status: AC | PRN
  Administered 2023-05-09: 5 mg via ORAL

## 2023-05-09 MED ORDER — ROCURONIUM BROMIDE 10 MG/ML (PF) SYRINGE
PREFILLED_SYRINGE | INTRAVENOUS | Status: AC
  Filled 2023-05-09: qty 10

## 2023-05-09 MED ORDER — CEFAZOLIN SODIUM-DEXTROSE 2-4 GM/100ML-% IV SOLN
2.0000 g | INTRAVENOUS | Status: AC
  Administered 2023-05-09: 2 g via INTRAVENOUS
  Filled 2023-05-09: qty 100

## 2023-05-09 MED ORDER — ROCURONIUM BROMIDE 10 MG/ML (PF) SYRINGE
PREFILLED_SYRINGE | INTRAVENOUS | Status: DC | PRN
  Administered 2023-05-09: 60 mg via INTRAVENOUS

## 2023-05-09 MED ORDER — FENTANYL CITRATE (PF) 250 MCG/5ML IJ SOLN
INTRAMUSCULAR | Status: AC
  Filled 2023-05-09: qty 5

## 2023-05-09 MED ORDER — OXYCODONE HCL 5 MG/5ML PO SOLN: 5.0000 mg | Freq: Once | ORAL | Status: AC | PRN

## 2023-05-09 MED ORDER — LACTATED RINGERS IV SOLN: INTRAVENOUS | Status: DC

## 2023-05-09 MED ORDER — TRAMADOL HCL 50 MG PO TABS: 50.0000 mg | ORAL_TABLET | Freq: Four times a day (QID) | ORAL | 0 refills | Status: DC | PRN

## 2023-05-09 MED ORDER — LIDOCAINE 2% (20 MG/ML) 5 ML SYRINGE
INTRAMUSCULAR | Status: AC
  Filled 2023-05-09: qty 5

## 2023-05-09 MED ORDER — ONDANSETRON HCL 4 MG PO TABS: 4.0000 mg | ORAL_TABLET | Freq: Three times a day (TID) | ORAL | 0 refills | Status: DC | PRN

## 2023-05-09 MED ORDER — PROPOFOL 10 MG/ML IV BOLUS
INTRAVENOUS | Status: AC
  Filled 2023-05-09: qty 20

## 2023-05-09 MED ORDER — 0.9 % SODIUM CHLORIDE (POUR BTL) OPTIME
TOPICAL | Status: DC | PRN
  Administered 2023-05-09: 1000 mL

## 2023-05-09 MED ORDER — SODIUM CHLORIDE 0.9 % IR SOLN
Status: DC | PRN
  Administered 2023-05-09: 1000 mL

## 2023-05-09 MED ORDER — PROPOFOL 10 MG/ML IV BOLUS
INTRAVENOUS | Status: DC | PRN
  Administered 2023-05-09: 150 mg via INTRAVENOUS

## 2023-05-09 MED ORDER — HYDROMORPHONE HCL 1 MG/ML IJ SOLN
0.2500 mg | INTRAMUSCULAR | Status: DC | PRN
  Administered 2023-05-09 (×2): 0.25 mg via INTRAVENOUS
  Administered 2023-05-09: 0.5 mg via INTRAVENOUS

## 2023-05-09 MED ORDER — DEXAMETHASONE SODIUM PHOSPHATE 10 MG/ML IJ SOLN
INTRAMUSCULAR | Status: AC
  Filled 2023-05-09: qty 1

## 2023-05-09 MED ORDER — MIDAZOLAM HCL 2 MG/2ML IJ SOLN
INTRAMUSCULAR | Status: AC
  Filled 2023-05-09: qty 2

## 2023-05-09 MED ORDER — BUPIVACAINE-EPINEPHRINE (PF) 0.25% -1:200000 IJ SOLN
INTRAMUSCULAR | Status: AC
  Filled 2023-05-09: qty 30

## 2023-05-09 MED ORDER — LIDOCAINE 2% (20 MG/ML) 5 ML SYRINGE
INTRAMUSCULAR | Status: DC | PRN
Start: 2023-05-09 — End: 2023-05-09
  Administered 2023-05-09: 60 mg via INTRAVENOUS

## 2023-05-09 MED ORDER — ONDANSETRON HCL 4 MG/2ML IJ SOLN: 4.0000 mg | Freq: Once | INTRAMUSCULAR | Status: DC | PRN

## 2023-05-09 MED ORDER — SUGAMMADEX SODIUM 200 MG/2ML IV SOLN
INTRAVENOUS | Status: DC | PRN
  Administered 2023-05-09: 200 mg via INTRAVENOUS

## 2023-05-09 MED ORDER — FENTANYL CITRATE (PF) 250 MCG/5ML IJ SOLN
INTRAMUSCULAR | Status: DC | PRN
Start: 2023-05-09 — End: 2023-05-09
  Administered 2023-05-09: 50 ug via INTRAVENOUS
  Administered 2023-05-09: 100 ug via INTRAVENOUS
  Administered 2023-05-09: 50 ug via INTRAVENOUS

## 2023-05-09 MED ORDER — OXYCODONE HCL 5 MG PO TABS
ORAL_TABLET | ORAL | Status: AC
  Filled 2023-05-09: qty 1

## 2023-05-09 MED ORDER — ONDANSETRON HCL 4 MG/2ML IJ SOLN
INTRAMUSCULAR | Status: AC
  Filled 2023-05-09: qty 2

## 2023-05-09 SURGICAL SUPPLY — 33 items
APPLIER CLIP 5 13 M/L LIGAMAX5 (MISCELLANEOUS) ×1
BAG COUNTER SPONGE SURGICOUNT (BAG) ×1 IMPLANT
CANISTER SUCT 3000ML PPV (MISCELLANEOUS) ×1 IMPLANT
CHLORAPREP W/TINT 26 (MISCELLANEOUS) ×1 IMPLANT
CLIP APPLIE 5 13 M/L LIGAMAX5 (MISCELLANEOUS) ×1 IMPLANT
COVER SURGICAL LIGHT HANDLE (MISCELLANEOUS) ×1 IMPLANT
DERMABOND ADVANCED .7 DNX12 (GAUZE/BANDAGES/DRESSINGS) ×1 IMPLANT
ELECT REM PT RETURN 9FT ADLT (ELECTROSURGICAL) ×1
ELECTRODE REM PT RTRN 9FT ADLT (ELECTROSURGICAL) ×1 IMPLANT
GLOVE SURG SIGNA 7.5 PF LTX (GLOVE) ×1 IMPLANT
GOWN STRL REUS W/ TWL LRG LVL3 (GOWN DISPOSABLE) ×2 IMPLANT
GOWN STRL REUS W/ TWL XL LVL3 (GOWN DISPOSABLE) ×1 IMPLANT
IRRIG SUCT STRYKERFLOW 2 WTIP (MISCELLANEOUS) ×1
IRRIGATION SUCT STRKRFLW 2 WTP (MISCELLANEOUS) ×1 IMPLANT
KIT BASIN OR (CUSTOM PROCEDURE TRAY) ×1 IMPLANT
KIT TURNOVER KIT B (KITS) ×1 IMPLANT
NS IRRIG 1000ML POUR BTL (IV SOLUTION) ×1 IMPLANT
PAD ARMBOARD 7.5X6 YLW CONV (MISCELLANEOUS) ×1 IMPLANT
SCISSORS LAP 5X35 DISP (ENDOMECHANICALS) ×1 IMPLANT
SET TUBE SMOKE EVAC HIGH FLOW (TUBING) ×1 IMPLANT
SLEEVE Z-THREAD 5X100MM (TROCAR) ×2 IMPLANT
SPECIMEN JAR SMALL (MISCELLANEOUS) ×1 IMPLANT
SUT MNCRL AB 4-0 PS2 18 (SUTURE) ×1 IMPLANT
SUT VICRYL 0 UR6 27IN ABS (SUTURE) IMPLANT
SYS BAG RETRIEVAL 10MM (BASKET) ×1
SYSTEM BAG RETRIEVAL 10MM (BASKET) ×1 IMPLANT
TOWEL GREEN STERILE (TOWEL DISPOSABLE) ×1 IMPLANT
TOWEL GREEN STERILE FF (TOWEL DISPOSABLE) ×1 IMPLANT
TRAY LAPAROSCOPIC MC (CUSTOM PROCEDURE TRAY) ×1 IMPLANT
TROCAR BALLN 12MMX100 BLUNT (TROCAR) ×1 IMPLANT
TROCAR Z-THREAD OPTICAL 5X100M (TROCAR) ×1 IMPLANT
WARMER LAPAROSCOPE (MISCELLANEOUS) ×1 IMPLANT
WATER STERILE IRR 1000ML POUR (IV SOLUTION) ×1 IMPLANT

## 2023-05-09 NOTE — Anesthesia Procedure Notes (Signed)
Procedure Name: Intubation Date/Time: 05/09/2023 7:36 AM  Performed by: Eulah Pont, CRNAPre-anesthesia Checklist: Patient identified, Emergency Drugs available, Suction available and Patient being monitored Patient Re-evaluated:Patient Re-evaluated prior to induction Oxygen Delivery Method: Circle System Utilized Preoxygenation: Pre-oxygenation with 100% oxygen Induction Type: IV induction Ventilation: Mask ventilation without difficulty Laryngoscope Size: Miller and 2 Grade View: Grade II Tube type: Oral Tube size: 7.0 mm Number of attempts: 1 Airway Equipment and Method: Stylet and Oral airway Placement Confirmation: ETT inserted through vocal cords under direct vision, positive ETCO2 and breath sounds checked- equal and bilateral Secured at: 20 cm Tube secured with: Tape Dental Injury: Teeth and Oropharynx as per pre-operative assessment

## 2023-05-09 NOTE — Anesthesia Postprocedure Evaluation (Signed)
Anesthesia Post Note  Patient: Madeline Richmond  Procedure(s) Performed: LAPAROSCOPIC CHOLECYSTECTOMY     Patient location during evaluation: PACU Anesthesia Type: General Level of consciousness: awake and alert and oriented Pain management: pain level controlled Vital Signs Assessment: post-procedure vital signs reviewed and stable Respiratory status: spontaneous breathing, nonlabored ventilation and respiratory function stable Cardiovascular status: blood pressure returned to baseline and stable Postop Assessment: no apparent nausea or vomiting Anesthetic complications: no   No notable events documented.  Last Vitals:  Vitals:   05/09/23 0900 05/09/23 0915  BP: 128/68   Pulse: 81 68  Resp: 16 11  Temp:    SpO2: 97% 92%    Last Pain:  Vitals:   05/09/23 0900  TempSrc:   PainSc: 5                  Charlynn Salih A.

## 2023-05-09 NOTE — Op Note (Signed)
Laparoscopic Cholecystectomy Procedure Note  Indications: This patient presents with symptomatic gallbladder disease and will undergo laparoscopic cholecystectomy.  She has had a HIDA scan showing a 9% gallbladder ejection fraction.  She is also had an ultrasound showing small gallstones  Pre-operative Diagnosis: Biliary dyskinesia and symptomatic gallstones  Post-operative Diagnosis: Same  Surgeon: Abigail Miyamoto   Assistants: none  Anesthesia: General endotracheal anesthesia  ASA Class: 2  Procedure Details  The patient was seen again in the Holding Room. The risks, benefits, complications, treatment options, and expected outcomes were discussed with the patient. The possibilities of reaction to medication, pulmonary aspiration, perforation of viscus, bleeding, recurrent infection, finding a normal gallbladder, the need for additional procedures, failure to diagnose a condition, the possible need to convert to an open procedure, and creating a complication requiring transfusion or operation were discussed with the patient. The likelihood of improving the patient's symptoms with return to their baseline status is good.  The patient and/or family concurred with the proposed plan, giving informed consent. The site of surgery properly noted. The patient was taken to Operating Room, identified as Madeline Richmond and the procedure verified as Laparoscopic Cholecystectomy with Intraoperative Cholangiogram. A Time Out was held and the above information confirmed.  Prior to the induction of general anesthesia, antibiotic prophylaxis was administered. General endotracheal anesthesia was then administered and tolerated well. After the induction, the abdomen was prepped with Chloraprep and draped in sterile fashion. The patient was positioned in the supine position.  Local anesthetic agent was injected into the skin near the umbilicus and an incision made. We dissected down to the abdominal fascia  with blunt dissection.  The fascia was incised vertically and we entered the peritoneal cavity bluntly.  A pursestring suture of 0-Vicryl was placed around the fascial opening.  The Hasson cannula was inserted and secured with the stay suture.  Pneumoperitoneum was then created with CO2 and tolerated well without any adverse changes in the patient's vital signs. An 11-mm port was placed in the subxiphoid position.  Two 5-mm ports were placed in the right upper quadrant. All skin incisions were infiltrated with a local anesthetic agent before making the incision and placing the trocars.   We positioned the patient in reverse Trendelenburg, tilted slightly to the patient's left.  The liver was normal in appearance.  The gallbladder was identified, the fundus grasped and retracted cephalad.taking care not to injure any adjacent organs or viscus. The infundibulum was grasped and retracted laterally, exposing the peritoneum overlying the triangle of Calot. This was then divided and exposed in a blunt fashion. The cystic duct was clearly identified and bluntly dissected circumferentially. A critical view of the cystic duct and cystic artery was obtained.  The cystic duct was then ligated with clips and divided. The cystic artery was, dissected free, ligated with clips and divided as well.   The gallbladder was dissected from the liver bed in retrograde fashion with the electrocautery. The gallbladder was removed and placed in an Endocatch sac. The liver bed was irrigated and inspected. Hemostasis was achieved with the electrocautery. Copious irrigation was utilized and was repeatedly aspirated until clear.  The gallbladder and Endocatch sac were then removed through the umbilical port site.  The pursestring suture was used to close the umbilical fascia.    We again inspected the right upper quadrant for hemostasis.  Pneumoperitoneum was released as we removed the trocars.  4-0 Monocryl was used to close the skin.  Skin glue was then applied.  The patient was then extubated and brought to the recovery room in stable condition. Instrument, sponge, and needle counts were correct at closure and at the conclusion of the case.   Findings: Cholecystitis with small Cholelithiasis NORMAL APPEARING LIVER  Estimated Blood Loss: Minimal         Drains: none         Specimens: Gallbladder           Complications: None; patient tolerated the procedure well.         Disposition: PACU - hemodynamically stable.         Condition: stable

## 2023-05-09 NOTE — Discharge Instructions (Signed)
[facility] ______[facility] LAPAROSCOPIC SURGERY: POST OP INSTRUCTIONS Always review your discharge instruction sheet given to you by the facility where your surgery was performed. IF YOU HAVE DISABILITY OR FAMILY LEAVE FORMS, YOU MUST BRING THEM TO THE OFFICE FOR PROCESSING.   DO NOT GIVE THEM TO YOUR DOCTOR.  A prescription for pain medication may be given to you upon discharge.  Take your pain medication as prescribed, if needed.  If narcotic pain medicine is not needed, then you may take acetaminophen (Tylenol) or ibuprofen (Advil) as needed. Take your usually prescribed medications unless otherwise directed. If you need a refill on your pain medication, please contact your pharmacy.  They will contact our office to request authorization. Prescriptions will not be filled after 5pm or on week-ends. You should follow a light diet the first few days after arrival home, such as soup and crackers, etc.  Be sure to include lots of fluids daily. Most patients will experience some swelling and bruising in the area of the incisions.  Ice packs will help.  Swelling and bruising can take several days to resolve.  It is common to experience some constipation if taking pain medication after surgery.  Increasing fluid intake and taking a stool softener (such as Colace) will usually help or prevent this problem from occurring.  A mild laxative (Milk of Magnesia or Miralax) should be taken according to package instructions if there are no bowel movements after 48 hours. Unless discharge instructions indicate otherwise, you may remove your bandages 24-48 hours after surgery, and you may shower at that time.  You may have steri-strips (small skin tapes) in place directly over the incision.  These strips should be left on the skin for 7-10 days.  If your surgeon used skin glue on the incision, you may shower in 24 hours.  The glue will flake off over the next 2-3 weeks.  Any sutures or staples will be  removed at the office during your follow-up visit. ACTIVITIES:  You may resume regular (light) daily activities beginning the next day--such as daily self-care, walking, climbing stairs--gradually increasing activities as tolerated.  You may have sexual intercourse when it is comfortable.  Refrain from any heavy lifting or straining until approved by your doctor. You may drive when you are no longer taking prescription pain medication, you can comfortably wear a seatbelt, and you can safely maneuver your car and apply brakes. RETURN TO WORK:  __________________________________________________________ You should see your doctor in the office for a follow-up appointment approximately 2-3 weeks after your surgery.  Make sure that you call for this appointment within a day or two after you arrive home to insure a convenient appointment time. OTHER INSTRUCTIONS: YOU MAY SHOWER STARTING TOMORROW ICE PACK, TYLENOL, AND IBUPROFEN ALSO FOR PAIN NO LIFTING MORE THAN 15 POUNDS FOR 2 WEEKS __________________________________________________________________________________________________________________________ __________________________________________________________________________________________________________________________ WHEN TO CALL YOUR DOCTOR: Fever over 101.0 Inability to urinate Continued bleeding from incision. Increased pain, redness, or drainage from the incision. Increasing abdominal pain  The clinic staff is available to answer your questions during regular business hours.  Please don't hesitate to call and ask to speak to one of the nurses for clinical concerns.  If you have a medical emergency, go to the nearest emergency room or call 911.  A surgeon from [facility] is always on call at the hospital. [address], [address], [place], [place]  [address] ? [address] [address], [place], [place]   [address] [phone] ? [phone] ?   FAX [phone] Web site:  [email or web address] 

## 2023-05-09 NOTE — Anesthesia Preprocedure Evaluation (Signed)
Anesthesia Evaluation  Patient identified by MRN, date of birth, ID band Patient awake    Reviewed: Allergy & Precautions, NPO status , Patient's Chart, lab work & pertinent test results  Airway Mallampati: II  TM Distance: >3 FB     Dental no notable dental hx. (+) Caps, Dental Advisory Given, Teeth Intact   Pulmonary neg pulmonary ROS   Pulmonary exam normal breath sounds clear to auscultation       Cardiovascular negative cardio ROS Normal cardiovascular exam Rhythm:Regular Rate:Normal     Neuro/Psych  PSYCHIATRIC DISORDERS Anxiety Depression    negative neurological ROS     GI/Hepatic Neg liver ROS,GERD  Medicated,,Cholelithiasis-symptomatic   Endo/Other  GLP-1 RA therapy   Renal/GU negative Renal ROS  negative genitourinary   Musculoskeletal  (+) Arthritis , Osteoarthritis,    Abdominal   Peds  Hematology negative hematology ROS (+)   Anesthesia Other Findings   Reproductive/Obstetrics                             Anesthesia Physical Anesthesia Plan  ASA: 2  Anesthesia Plan: General   Post-op Pain Management: Dilaudid IV, Ofirmev IV (intra-op)*, Precedex and Tylenol PO (pre-op)*   Induction: Intravenous and Cricoid pressure planned  PONV Risk Score and Plan: 4 or greater and Treatment may vary due to age or medical condition, Midazolam, Ondansetron and Dexamethasone  Airway Management Planned: Oral ETT  Additional Equipment: None  Intra-op Plan:   Post-operative Plan: Extubation in OR  Informed Consent: I have reviewed the patients History and Physical, chart, labs and discussed the procedure including the risks, benefits and alternatives for the proposed anesthesia with the patient or authorized representative who has indicated his/her understanding and acceptance.     Dental advisory given  Plan Discussed with: CRNA and Anesthesiologist  Anesthesia Plan Comments:         Anesthesia Quick Evaluation

## 2023-05-09 NOTE — Interval H&P Note (Signed)
History and Physical Interval Note:no change in H and P  05/09/2023 6:46 AM  Madeline Richmond  has presented today for surgery, with the diagnosis of symptomatic gallstones.  The various methods of treatment have been discussed with the patient and family. After consideration of risks, benefits and other options for treatment, the patient has consented to  Procedure(s): LAPAROSCOPIC CHOLECYSTECTOMY (N/A) as a surgical intervention.  The patient's history has been reviewed, patient examined, no change in status, stable for surgery.  I have reviewed the patient's chart and labs.  Questions were answered to the patient's satisfaction.     Abigail Miyamoto

## 2023-05-09 NOTE — Transfer of Care (Signed)
Immediate Anesthesia Transfer of Care Note  Patient: Madeline Richmond  Procedure(s) Performed: LAPAROSCOPIC CHOLECYSTECTOMY  Patient Location: PACU  Anesthesia Type:General  Level of Consciousness: awake, drowsy, and patient cooperative  Airway & Oxygen Therapy: Patient Spontanous Breathing  Post-op Assessment: Report given to RN, Post -op Vital signs reviewed and stable, and Patient moving all extremities X 4  Post vital signs: Reviewed and stable  Last Vitals:  Vitals Value Taken Time  BP 160/84 05/09/23 0820  Temp    Pulse 78 05/09/23 0822  Resp 17 05/09/23 0822  SpO2 96 % 05/09/23 0822  Vitals shown include unfiled device data.  Last Pain:  Vitals:   05/09/23 0552  TempSrc:   PainSc: 0-No pain         Complications: No notable events documented.

## 2023-05-12 ENCOUNTER — Encounter (HOSPITAL_COMMUNITY): Payer: Self-pay | Admitting: Surgery

## 2023-05-12 LAB — SURGICAL PATHOLOGY

## 2023-10-22 ENCOUNTER — Other Ambulatory Visit: Payer: Self-pay | Admitting: Internal Medicine

## 2023-10-22 DIAGNOSIS — Z1231 Encounter for screening mammogram for malignant neoplasm of breast: Secondary | ICD-10-CM

## 2023-12-02 ENCOUNTER — Other Ambulatory Visit: Payer: Self-pay | Admitting: Medical Genetics

## 2023-12-08 ENCOUNTER — Ambulatory Visit
Admission: RE | Admit: 2023-12-08 | Discharge: 2023-12-08 | Disposition: A | Discharge status: Home / Self Care | Source: Ambulatory Visit | Attending: Internal Medicine | Admitting: Internal Medicine

## 2023-12-08 DIAGNOSIS — Z1231 Encounter for screening mammogram for malignant neoplasm of breast: Secondary | ICD-10-CM

## 2023-12-11 ENCOUNTER — Other Ambulatory Visit

## 2024-01-27 ENCOUNTER — Ambulatory Visit (HOSPITAL_BASED_OUTPATIENT_CLINIC_OR_DEPARTMENT_OTHER)
Admission: RE | Admit: 2024-01-27 | Discharge: 2024-01-27 | Disposition: A | Discharge status: Home / Self Care | Source: Ambulatory Visit | Attending: Vascular Surgery | Admitting: Vascular Surgery

## 2024-01-27 ENCOUNTER — Other Ambulatory Visit (HOSPITAL_COMMUNITY): Payer: Self-pay | Admitting: Family Medicine

## 2024-01-27 DIAGNOSIS — M00061 Staphylococcal arthritis, right knee: Secondary | ICD-10-CM | POA: Diagnosis not present

## 2024-01-27 DIAGNOSIS — M25561 Pain in right knee: Secondary | ICD-10-CM | POA: Diagnosis not present

## 2024-01-27 DIAGNOSIS — R609 Edema, unspecified: Secondary | ICD-10-CM | POA: Insufficient documentation

## 2024-01-28 ENCOUNTER — Other Ambulatory Visit (HOSPITAL_COMMUNITY): Payer: Self-pay

## 2024-01-28 ENCOUNTER — Emergency Department (HOSPITAL_COMMUNITY)

## 2024-01-28 ENCOUNTER — Inpatient Hospital Stay (HOSPITAL_COMMUNITY)
Admission: EM | Admit: 2024-01-28 | Discharge: 2024-02-02 | DRG: 486 | Disposition: A | Discharge status: Home Health | Attending: Internal Medicine | Admitting: Internal Medicine

## 2024-01-28 ENCOUNTER — Encounter (HOSPITAL_COMMUNITY)
Admission: EM | Disposition: A | Discharge status: Home / Self Care | Payer: Self-pay | Source: Home / Self Care | Attending: Internal Medicine

## 2024-01-28 ENCOUNTER — Encounter (HOSPITAL_COMMUNITY): Payer: Self-pay | Admitting: Internal Medicine

## 2024-01-28 ENCOUNTER — Other Ambulatory Visit: Payer: Self-pay

## 2024-01-28 ENCOUNTER — Observation Stay (HOSPITAL_COMMUNITY): Admitting: Anesthesiology

## 2024-01-28 DIAGNOSIS — F32A Depression, unspecified: Secondary | ICD-10-CM | POA: Diagnosis present

## 2024-01-28 DIAGNOSIS — M00061 Staphylococcal arthritis, right knee: Principal | ICD-10-CM | POA: Diagnosis present

## 2024-01-28 DIAGNOSIS — M009 Pyogenic arthritis, unspecified: Principal | ICD-10-CM | POA: Diagnosis present

## 2024-01-28 DIAGNOSIS — Z7985 Long-term (current) use of injectable non-insulin antidiabetic drugs: Secondary | ICD-10-CM

## 2024-01-28 DIAGNOSIS — D72829 Elevated white blood cell count, unspecified: Secondary | ICD-10-CM | POA: Diagnosis not present

## 2024-01-28 DIAGNOSIS — E871 Hypo-osmolality and hyponatremia: Secondary | ICD-10-CM | POA: Diagnosis not present

## 2024-01-28 DIAGNOSIS — L309 Dermatitis, unspecified: Secondary | ICD-10-CM | POA: Diagnosis present

## 2024-01-28 DIAGNOSIS — Z7401 Bed confinement status: Secondary | ICD-10-CM

## 2024-01-28 DIAGNOSIS — Z8249 Family history of ischemic heart disease and other diseases of the circulatory system: Secondary | ICD-10-CM

## 2024-01-28 DIAGNOSIS — W19XXXA Unspecified fall, initial encounter: Secondary | ICD-10-CM | POA: Diagnosis present

## 2024-01-28 DIAGNOSIS — F419 Anxiety disorder, unspecified: Secondary | ICD-10-CM | POA: Diagnosis present

## 2024-01-28 DIAGNOSIS — Z8261 Family history of arthritis: Secondary | ICD-10-CM

## 2024-01-28 DIAGNOSIS — K219 Gastro-esophageal reflux disease without esophagitis: Secondary | ICD-10-CM | POA: Diagnosis present

## 2024-01-28 DIAGNOSIS — M1990 Unspecified osteoarthritis, unspecified site: Secondary | ICD-10-CM | POA: Diagnosis present

## 2024-01-28 DIAGNOSIS — B9561 Methicillin susceptible Staphylococcus aureus infection as the cause of diseases classified elsewhere: Secondary | ICD-10-CM | POA: Diagnosis present

## 2024-01-28 DIAGNOSIS — K5900 Constipation, unspecified: Secondary | ICD-10-CM | POA: Diagnosis not present

## 2024-01-28 HISTORY — PX: KNEE ARTHROSCOPY: SHX127

## 2024-01-28 LAB — BASIC METABOLIC PANEL WITH GFR
Anion gap: 14 (ref 5–15)
BUN: 14 mg/dL (ref 8–23)
CO2: 20 mmol/L — ABNORMAL LOW (ref 22–32)
Calcium: 8.5 mg/dL — ABNORMAL LOW (ref 8.9–10.3)
Chloride: 98 mmol/L (ref 98–111)
Creatinine, Ser: 0.66 mg/dL (ref 0.44–1.00)
GFR, Estimated: >60 mL/min (ref >60)
Glucose, Bld: 105 mg/dL — ABNORMAL HIGH (ref 70–99)
Potassium: 3.8 mmol/L (ref 3.5–5.1)
Sodium: 132 mmol/L — ABNORMAL LOW (ref 135–145)

## 2024-01-28 LAB — CBC WITH DIFFERENTIAL/PLATELET
Abs Immature Granulocytes: 0.04 K/uL (ref 0.00–0.07)
Basophils Absolute: 0.1 K/uL (ref 0.0–0.1)
Basophils Relative: 0 %
Eosinophils Absolute: 0 K/uL (ref 0.0–0.5)
Eosinophils Relative: 0 %
HCT: 37.6 % (ref 36.0–46.0)
Hemoglobin: 12.3 g/dL (ref 12.0–15.0)
Immature Granulocytes: 0 %
Lymphocytes Relative: 10 %
Lymphs Abs: 1.2 K/uL (ref 0.7–4.0)
MCH: 30 pg (ref 26.0–34.0)
MCHC: 32.7 g/dL (ref 30.0–36.0)
MCV: 91.7 fL (ref 80.0–100.0)
Monocytes Absolute: 1.2 K/uL — ABNORMAL HIGH (ref 0.1–1.0)
Monocytes Relative: 10 %
Neutro Abs: 9.6 K/uL — ABNORMAL HIGH (ref 1.7–7.7)
Neutrophils Relative %: 80 %
Platelets: 240 K/uL (ref 150–400)
RBC: 4.1 MIL/uL (ref 3.87–5.11)
RDW: 11.9 % (ref 11.5–15.5)
WBC: 12.1 K/uL — ABNORMAL HIGH (ref 4.0–10.5)
nRBC: 0 % (ref 0.0–0.2)

## 2024-01-28 LAB — I-STAT CG4 LACTIC ACID, ED: Lactic Acid, Venous: 1.2 mmol/L (ref 0.5–1.9)

## 2024-01-28 LAB — SYNOVIAL CELL COUNT + DIFF, W/ CRYSTALS
Crystals, Fluid: NONE SEEN
Eosinophils-Synovial: 0 % (ref 0–1)
Lymphocytes-Synovial Fld: 3 % (ref 0–20)
Monocyte-Macrophage-Synovial Fluid: 0 % — ABNORMAL LOW (ref 50–90)
Neutrophil, Synovial: 97 % — ABNORMAL HIGH (ref 0–25)
WBC, Synovial: 26000 /mm3 — ABNORMAL HIGH (ref 0–200)

## 2024-01-28 LAB — HIV ANTIBODY (ROUTINE TESTING W REFLEX): HIV Screen 4th Generation wRfx: NONREACTIVE

## 2024-01-28 MED ORDER — ONDANSETRON HCL 4 MG/2ML IJ SOLN
INTRAMUSCULAR | Status: DC | PRN
  Administered 2024-01-28: 4 mg via INTRAVENOUS

## 2024-01-28 MED ORDER — PROPOFOL 10 MG/ML IV BOLUS
INTRAVENOUS | Status: AC
  Filled 2024-01-28: qty 20

## 2024-01-28 MED ORDER — FENTANYL CITRATE (PF) 250 MCG/5ML IJ SOLN
INTRAMUSCULAR | Status: DC | PRN
  Administered 2024-01-28: 100 ug via INTRAVENOUS

## 2024-01-28 MED ORDER — HYDROCODONE-ACETAMINOPHEN 5-325 MG PO TABS: 1.0000 | ORAL_TABLET | ORAL | Status: DC | PRN

## 2024-01-28 MED ORDER — MORPHINE SULFATE (PF) 2 MG/ML IV SOLN
2.0000 mg | INTRAVENOUS | Status: AC | PRN
Start: 2024-01-28 — End: 2024-01-29
  Administered 2024-01-28 – 2024-01-29 (×3): 2 mg via INTRAVENOUS
  Filled 2024-01-28 (×3): qty 1

## 2024-01-28 MED ORDER — ACETAMINOPHEN 650 MG RE SUPP: 650.0000 mg | Freq: Four times a day (QID) | RECTAL | Status: DC | PRN

## 2024-01-28 MED ORDER — THIAMINE HCL 100 MG/ML IJ SOLN
100.0000 mg | Freq: Every day | INTRAMUSCULAR | Status: DC
  Filled 2024-01-28 (×2): qty 2

## 2024-01-28 MED ORDER — FENTANYL CITRATE (PF) 100 MCG/2ML IJ SOLN
INTRAMUSCULAR | Status: AC
  Filled 2024-01-28: qty 2

## 2024-01-28 MED ORDER — FENTANYL CITRATE (PF) 100 MCG/2ML IJ SOLN
25.0000 ug | INTRAMUSCULAR | Status: DC | PRN
  Administered 2024-01-28 (×2): 50 ug via INTRAVENOUS

## 2024-01-28 MED ORDER — ADULT MULTIVITAMIN W/MINERALS CH
1.0000 | ORAL_TABLET | Freq: Every day | ORAL | Status: DC
  Administered 2024-01-29 – 2024-02-02 (×5): 1 via ORAL
  Filled 2024-01-28 (×5): qty 1

## 2024-01-28 MED ORDER — MORPHINE SULFATE (PF) 4 MG/ML IV SOLN
INTRAVENOUS | Status: AC
  Filled 2024-01-28: qty 1

## 2024-01-28 MED ORDER — LORAZEPAM 2 MG/ML IJ SOLN: 1.0000 mg | INTRAMUSCULAR | Status: AC | PRN

## 2024-01-28 MED ORDER — SUGAMMADEX SODIUM 200 MG/2ML IV SOLN
INTRAVENOUS | Status: DC | PRN
  Administered 2024-01-28: 50 mg via INTRAVENOUS

## 2024-01-28 MED ORDER — AMISULPRIDE (ANTIEMETIC) 5 MG/2ML IV SOLN: 10.0000 mg | Freq: Once | INTRAVENOUS | Status: DC | PRN

## 2024-01-28 MED ORDER — CALCIUM GLUCONATE-NACL 1-0.675 GM/50ML-% IV SOLN: 1.0000 g | Freq: Once | INTRAVENOUS | Status: DC

## 2024-01-28 MED ORDER — POVIDONE-IODINE 10 % EX SWAB
2.0000 | Freq: Once | CUTANEOUS | Status: AC
  Administered 2024-01-28: 2 via TOPICAL

## 2024-01-28 MED ORDER — CEFAZOLIN SODIUM 1 G IJ SOLR
INTRAMUSCULAR | Status: AC
  Filled 2024-01-28: qty 20

## 2024-01-28 MED ORDER — CLONIDINE HCL (ANALGESIA) 100 MCG/ML EP SOLN
150.0000 ug | EPIDURAL | Status: DC
  Filled 2024-01-28: qty 1.5

## 2024-01-28 MED ORDER — LORAZEPAM 1 MG PO TABS: 1.0000 mg | ORAL_TABLET | ORAL | Status: AC | PRN

## 2024-01-28 MED ORDER — THIAMINE MONONITRATE 100 MG PO TABS
100.0000 mg | ORAL_TABLET | Freq: Every day | ORAL | Status: DC
  Administered 2024-01-29 – 2024-02-02 (×5): 100 mg via ORAL
  Filled 2024-01-28 (×6): qty 1

## 2024-01-28 MED ORDER — MIDAZOLAM HCL 2 MG/2ML IJ SOLN
INTRAMUSCULAR | Status: AC
  Filled 2024-01-28: qty 2

## 2024-01-28 MED ORDER — CEFAZOLIN SODIUM-DEXTROSE 1-4 GM/50ML-% IV SOLN
1.0000 g | Freq: Three times a day (TID) | INTRAVENOUS | Status: AC
  Administered 2024-01-29 – 2024-01-30 (×7): 1 g via INTRAVENOUS
  Filled 2024-01-28 (×7): qty 50

## 2024-01-28 MED ORDER — DEXAMETHASONE SOD PHOSPHATE PF 10 MG/ML IJ SOLN
INTRAMUSCULAR | Status: DC | PRN
Start: 2024-01-28 — End: 2024-01-28
  Administered 2024-01-28: 10 mg via INTRAVENOUS

## 2024-01-28 MED ORDER — LIDOCAINE 2% (20 MG/ML) 5 ML SYRINGE
INTRAMUSCULAR | Status: DC | PRN
  Administered 2024-01-28: 80 mg via INTRAVENOUS

## 2024-01-28 MED ORDER — ONDANSETRON HCL 4 MG/2ML IJ SOLN: 4.0000 mg | Freq: Four times a day (QID) | INTRAMUSCULAR | Status: DC | PRN

## 2024-01-28 MED ORDER — CHLORHEXIDINE GLUCONATE 4 % EX SOLN
60.0000 mL | Freq: Once | CUTANEOUS | Status: DC
  Filled 2024-01-28: qty 60

## 2024-01-28 MED ORDER — MIDAZOLAM HCL (PF) 2 MG/2ML IJ SOLN
INTRAMUSCULAR | Status: DC | PRN
  Administered 2024-01-28: 2 mg via INTRAVENOUS

## 2024-01-28 MED ORDER — ROCURONIUM BROMIDE 10 MG/ML (PF) SYRINGE
PREFILLED_SYRINGE | INTRAVENOUS | Status: DC | PRN
  Administered 2024-01-28: 50 mg via INTRAVENOUS

## 2024-01-28 MED ORDER — ORAL CARE MOUTH RINSE: 15.0000 mL | Freq: Once | OROMUCOSAL | Status: AC

## 2024-01-28 MED ORDER — BUPIVACAINE-EPINEPHRINE (PF) 0.25% -1:200000 IJ SOLN
INTRAMUSCULAR | Status: AC
  Filled 2024-01-28: qty 30

## 2024-01-28 MED ORDER — ENOXAPARIN SODIUM 40 MG/0.4ML IJ SOSY
40.0000 mg | PREFILLED_SYRINGE | INTRAMUSCULAR | Status: DC
  Administered 2024-01-28 – 2024-02-01 (×5): 40 mg via SUBCUTANEOUS
  Filled 2024-01-28 (×7): qty 0.4

## 2024-01-28 MED ORDER — LACTATED RINGERS IV SOLN: INTRAVENOUS | Status: DC

## 2024-01-28 MED ORDER — PHENYLEPHRINE 80 MCG/ML (10ML) SYRINGE FOR IV PUSH (FOR BLOOD PRESSURE SUPPORT)
PREFILLED_SYRINGE | INTRAVENOUS | Status: DC | PRN
  Administered 2024-01-28 (×2): 80 ug via INTRAVENOUS

## 2024-01-28 MED ORDER — MORPHINE SULFATE (PF) 4 MG/ML IV SOLN
4.0000 mg | Freq: Once | INTRAVENOUS | Status: AC
  Administered 2024-01-28: 4 mg via INTRAVENOUS
  Filled 2024-01-28: qty 1

## 2024-01-28 MED ORDER — BUPIVACAINE HCL (PF) 0.25 % IJ SOLN
INTRAMUSCULAR | Status: AC
Start: 2024-01-28 — End: 2024-01-28
  Filled 2024-01-28: qty 30

## 2024-01-28 MED ORDER — SODIUM CHLORIDE 0.9 % IV SOLN: INTRAVENOUS | Status: AC

## 2024-01-28 MED ORDER — PROPOFOL 10 MG/ML IV BOLUS
INTRAVENOUS | Status: DC | PRN
  Administered 2024-01-28: 150 mg via INTRAVENOUS

## 2024-01-28 MED ORDER — ACETAMINOPHEN 325 MG PO TABS
650.0000 mg | ORAL_TABLET | Freq: Four times a day (QID) | ORAL | Status: DC | PRN
  Administered 2024-01-28: 650 mg via ORAL
  Filled 2024-01-28: qty 2

## 2024-01-28 MED ORDER — ONDANSETRON HCL 4 MG/2ML IJ SOLN
4.0000 mg | Freq: Once | INTRAMUSCULAR | Status: AC
  Administered 2024-01-28: 4 mg via INTRAVENOUS
  Filled 2024-01-28: qty 2

## 2024-01-28 MED ORDER — HYDROMORPHONE HCL 1 MG/ML IJ SOLN
INTRAMUSCULAR | Status: AC
  Filled 2024-01-28: qty 0.5

## 2024-01-28 MED ORDER — ALBUTEROL SULFATE (2.5 MG/3ML) 0.083% IN NEBU: 2.5000 mg | INHALATION_SOLUTION | Freq: Four times a day (QID) | RESPIRATORY_TRACT | Status: DC | PRN

## 2024-01-28 MED ORDER — FLUOXETINE HCL 20 MG PO CAPS
40.0000 mg | ORAL_CAPSULE | Freq: Every morning | ORAL | Status: DC
  Administered 2024-01-29 – 2024-02-02 (×5): 40 mg via ORAL
  Filled 2024-01-28 (×5): qty 2

## 2024-01-28 MED ORDER — ONDANSETRON HCL 4 MG PO TABS: 4.0000 mg | ORAL_TABLET | Freq: Four times a day (QID) | ORAL | Status: DC | PRN

## 2024-01-28 MED ORDER — SODIUM CHLORIDE 0.9% FLUSH
3.0000 mL | Freq: Two times a day (BID) | INTRAVENOUS | Status: DC
  Administered 2024-01-28 – 2024-02-02 (×11): 3 mL via INTRAVENOUS

## 2024-01-28 MED ORDER — OXYCODONE-ACETAMINOPHEN 5-325 MG PO TABS
1.0000 | ORAL_TABLET | Freq: Four times a day (QID) | ORAL | Status: DC | PRN
  Administered 2024-01-30 – 2024-02-02 (×8): 1 via ORAL
  Filled 2024-01-28 (×8): qty 1

## 2024-01-28 MED ORDER — OXYCODONE HCL 5 MG PO TABS: 5.0000 mg | ORAL_TABLET | Freq: Once | ORAL | Status: DC | PRN

## 2024-01-28 MED ORDER — KETOROLAC TROMETHAMINE 30 MG/ML IJ SOLN
INTRAMUSCULAR | Status: DC | PRN
  Administered 2024-01-28: 30 mg via INTRAVENOUS

## 2024-01-28 MED ORDER — CEFAZOLIN SODIUM-DEXTROSE 2-3 GM-%(50ML) IV SOLR
INTRAVENOUS | Status: DC | PRN
  Administered 2024-01-28: 2 g via INTRAVENOUS

## 2024-01-28 MED ORDER — FOLIC ACID 1 MG PO TABS
1.0000 mg | ORAL_TABLET | Freq: Every day | ORAL | Status: DC
  Administered 2024-01-29 – 2024-02-02 (×5): 1 mg via ORAL
  Filled 2024-01-28 (×5): qty 1

## 2024-01-28 MED ORDER — HYDROMORPHONE HCL 1 MG/ML IJ SOLN
INTRAMUSCULAR | Status: DC | PRN
  Administered 2024-01-28: .5 mg via INTRAVENOUS

## 2024-01-28 MED ORDER — CHLORHEXIDINE GLUCONATE 0.12 % MT SOLN
15.0000 mL | Freq: Once | OROMUCOSAL | Status: AC
  Administered 2024-01-28: 15 mL via OROMUCOSAL
  Filled 2024-01-28: qty 15

## 2024-01-28 MED ORDER — OXYCODONE HCL 5 MG/5ML PO SOLN: 5.0000 mg | Freq: Once | ORAL | Status: DC | PRN

## 2024-01-28 SURGICAL SUPPLY — 2 items
NDL 18GX1X1/2 (RX/OR ONLY) (NEEDLE) IMPLANT
NDL HYPO 25GX1X1/2 BEV (NEEDLE) ×1 IMPLANT

## 2024-01-28 NOTE — ED Notes (Signed)
 Admitting MD at [facility].

## 2024-01-28 NOTE — Anesthesia Preprocedure Evaluation (Addendum)
 Anesthesia Evaluation  Patient identified by MRN, date of birth, ID band Patient awake    Reviewed: Allergy & Precautions, NPO status , Patient's Chart, lab work & pertinent test results  History of Anesthesia Complications Negative for: history of anesthetic complications  Airway Mallampati: II  TM Distance: >3 FB Neck ROM: Full   Comment: Previous grade II view with Miller 2, easy mask Dental  (+) Dental Advisory Given   Pulmonary neg pulmonary ROS   Pulmonary exam normal breath sounds clear to auscultation       Cardiovascular (-) hypertension(-) angina (-) Past MI, (-) Cardiac Stents and (-) CABG (-) dysrhythmias  Rhythm:Regular Rate:Normal  HLD   Neuro/Psych  PSYCHIATRIC DISORDERS Anxiety Depression    negative neurological ROS     GI/Hepatic Neg liver ROS,GERD  Medicated,,  Endo/Other  negative endocrine ROS    Renal/GU negative Renal ROS     Musculoskeletal  (+) Arthritis ,    Abdominal   Peds  Hematology negative hematology ROS (+) Lab Results      Component                Value               Date                      WBC                      12.1 (H)            01/28/2024                HGB                      12.3                01/28/2024                HCT                      37.6                01/28/2024                MCV                      91.7                01/28/2024                PLT                      240                 01/28/2024              Anesthesia Other Findings Last semaglutide: within the past week  Reproductive/Obstetrics                              Anesthesia Physical Anesthesia Plan  ASA: 2  Anesthesia Plan: General   Post-op Pain Management:    Induction:   PONV Risk Score and Plan: 3 and Ondansetron , Dexamethasone , Midazolam  and Treatment may vary due to age or medical condition  Airway Management Planned: Oral ETT  Additional  Equipment:   Intra-op Plan:   Post-operative Plan:  Extubation in OR  Informed Consent: I have reviewed the patients History and Physical, chart, labs and discussed the procedure including the risks, benefits and alternatives for the proposed anesthesia with the patient or authorized representative who has indicated his/her understanding and acceptance.     Dental advisory given  Plan Discussed with: CRNA and Anesthesiologist  Anesthesia Plan Comments: (Risks of general anesthesia discussed including, but not limited to, sore throat, hoarse voice, chipped/damaged teeth, injury to vocal cords, nausea and vomiting, allergic reactions, lung infection, heart attack, stroke, and death. All questions answered. )         Anesthesia Quick Evaluation

## 2024-01-28 NOTE — H&P (Addendum)
 History and Physical    Patient: Madeline Richmond FMW:[id number] DOB: 1960-12-25 DOA: 01/28/2024 DOS: the patient was seen and examined on 01/28/2024 PCP: Vernon Velna SAUNDERS, MD  Patient coming from: Home  Chief Complaint:  Chief Complaint  Patient presents with   Knee Pain   HPI: Madeline Richmond is a 63 y.o. female with medical history significant of anxiety, depression, and alcohol use presents with right knee pain and swelling following a cortisone injection.  She received a cortisone shot in her right knee last Thursday and traveled to [place]  the following day. By Saturday night, she experienced severe knee pain and difficulty walking, which has persisted since then.  She returned to her orthopedic doctor on Monday, who performed an arthrocentesis, removing 65 cc of fluid from the knee.    She has been taking doxycycline since Monday for the possibility of suspected infection. She was advised that analysis showed 68,000 white blood cells with 90% neutrophils for which she was advised to come to the emergency department. She vomited after taking the antibiotic without food this morning.  She reports significant knee swelling and tenderness, but no redness. The swelling is compared to her other knee.  She has not taken her regular medications, including Prozac , heartburn medication, and semaglutide, for several days to over a week due to being bedridden.  She drinks two to three glasses of alcohol nightly but has not consumed any since Sunday. She reports a sensation of heaviness or pressure in her stomach as she correlates possibly with not having alcohol recently, but denies feeling anxious or jittery.  In the emergency department patient was noted to have stable vital signs.  Orthopedics was consulted plan on taking for surgery.  Review of Systems: As mentioned in the history of present illness. All other systems reviewed and are negative. Past Medical History:  Diagnosis  Date   Anxiety    Arthritis    knee   Depression    Eczema    GERD (gastroesophageal reflux disease)    H/O seasonal allergies    Past Surgical History:  Procedure Laterality Date   BREAST EXCISIONAL BIOPSY Left 1996   benign   CHOLECYSTECTOMY N/A 05/09/2023   Procedure: LAPAROSCOPIC CHOLECYSTECTOMY;  Surgeon: Vernetta Berg, MD;  Location: [facility] OR;  Service: General;  Laterality: N/A;   COLONOSCOPY     EYE SURGERY     strabismus   REFRACTIVE SURGERY     Social History:  reports that she has never smoked. She has never used smokeless tobacco. She reports current alcohol use of about 7.0 standard drinks of alcohol per week. She reports that she does not use drugs.  Allergies  Allergen Reactions   Codeine Camsylate [Codeine] Nausea And Vomiting    Family History  Problem Relation Age of Onset   Arthritis Mother    Prostate cancer Father 72       metastatic   Drug abuse Father    Hypertension Father    Breast cancer Sister 68   Melanoma Brother     Prior to Admission medications   Medication Sig Start Date End Date Taking? Authorizing Provider  ALPRAZolam  (XANAX ) 0.5 MG tablet 1/2 tablet 06/26/21   [provider]  augmented betamethasone  dipropionate (DIPROLENE -AF) 0.05 % cream Apply 1 Application topically 2 (two) times daily as needed (skin irritation.).    [provider]  FLUoxetine  (PROZAC ) 40 MG capsule Take 40 mg by mouth in the morning.    [provider]  guaiFENesin (  MUCINEX) 600 MG 12 hr tablet Take 600 mg by mouth 2 (two) times daily as needed (congestion.).    [provider]  ibuprofen (ADVIL) 200 MG tablet Take 400 mg by mouth every 8 (eight) hours as needed (pain.).    [provider]  loratadine (CLARITIN) 10 MG tablet Take 10 mg by mouth daily.    [provider]  omeprazole  (PRILOSEC) 20 MG capsule Take 20 mg by mouth in the morning and at bedtime.    [provider]  ondansetron  (ZOFRAN ) 4  MG tablet Take 1 tablet (4 mg total) by mouth every 8 (eight) hours as needed for nausea or vomiting. 05/09/23   Vernetta Berg, MD  oxymetazoline (AFRIN) 0.05 % nasal spray Place 1-2 sprays into both nostrils 2 (two) times daily as needed for congestion.    [provider]  [facility] MANAG[facility] [place] Inject 1 mg into the skin every Thursday. Semaglutide/B12 2MG /1MG /ML Injection    [provider]  tacrolimus (PROTOPIC) 0.1 % ointment Apply 1 Application topically 2 (two) times daily as needed (skin irritation.).    [provider]  traMADol  (ULTRAM ) 50 MG tablet Take 1 tablet (50 mg total) by mouth every 6 (six) hours as needed for moderate pain (pain score 4-6) or severe pain (pain score 7-10). 05/09/23   Vernetta Berg, MD    Physical Exam: Vitals:   01/28/24 0853 01/28/24 0903  BP: 132/68   Pulse: 97   Resp: 19   Temp: 99 F (37.2 C)   SpO2: 100%   Weight:  68 kg  Height:  5' 7 (1.702 m)    Constitutional: Older adult female who appears to be in no acute distress Eyes: PERRL, lids and conjunctivae normal ENMT: Mucous membranes are moist. Posterior pharynx clear of any exudate or lesions.Normal dentition.  Neck: normal, supple  Respiratory: clear to auscultation bilaterally, no wheezing, no crackles. Normal respiratory effort. No accessory muscle use.  Cardiovascular: Regular rate and rhythm, no murmurs / rubs / gallops. No extremity edema. 2+ pedal pulses.  Abdomen: no tenderness, no masses palpated.  Bowel sounds positive.  Musculoskeletal: no clubbing / cyanosis.  Swelling of the right knee without significant erythema.  Range of motion limited due to discomfort Skin: no rashes, lesions, ulcers.   Neurologic: CN 2-12 grossly intact.  Strength 5/5 in all 4.  Psychiatric: Normal judgment and insight. Alert and oriented x 3. Normal mood.   Data Reviewed:  Reviewed labs, imaging, and pertinent records as documented.  Assessment and  Plan:   Right knee septic arthritis Acute patient presents with complaints of right knee pain and swelling for which she has been unable to ambulate over the last couple of days after having a cortisone shot 6 days ago.  Patient had arthrocentesis obtained 2 days ago which revealed 68,000 and WBCs and 90% neutrophils concerning for septic arthritis.  Patient had been started on doxycycline which she took over the last 2 days.  Orthopedics consulted and plan on taking for arthroscopy - Admit to a med-surge bed - N.p.o. - Holding on antibiotics with plans to start antibiotics following procedure of vancomycin and Rocephin - Oxycodone /morphine  IV as needed for moderate to severe pain - Consider consult to ID in a.m once culture data obtained  - Appreciate orthopedic surgery consultative services we will follow-up for any further recommendations  Leukocytosis Acute.  WBC elevated 12.1.  Lactic acid was reassuring at 1.2.  Thought secondary to above. - Recheck CBC tomorrow morning  Hyponatremia Acute.  Sodium noted to be 132.  Possibly secondary to poor p.o. intake due to mobility. - Normal saline IV fluids - Recheck sodium levels in a.m.  Anxiety and depression - Continue Prozac   GERD - Continue Prilosec  DVT prophylaxis: Lovenox Advance Care Planning:   Code Status: Full Code   Consults: Orthopedic surgery  Family Communication: None  Severity of Illness: The appropriate patient status for this patient is OBSERVATION. Observation status is judged to be reasonable and necessary in order to provide the required intensity of service to ensure the patient's safety. The patient's presenting symptoms, physical exam findings, and initial radiographic and laboratory data in the context of their medical condition is felt to place them at decreased risk for further clinical deterioration. Furthermore, it is anticipated that the patient will be medically stable for discharge from the hospital  within 2 midnights of admission.   Author: Maximino DELENA Sharps, MD 01/28/2024 11:22 AM  For on call review [email or web address].

## 2024-01-28 NOTE — ED Notes (Signed)
 BLE wiped down with chlorhex wipes. R knee scrubbed with chlor hex swab.

## 2024-01-28 NOTE — Op Note (Signed)
 DATE OF SURGERY:  01/28/2024  TIME: 6:46 PM  PATIENT NAME:  Madeline Richmond  AGE: 63 y.o.  PRE-OPERATIVE DIAGNOSIS:  septic jointknee  POST-OPERATIVE DIAGNOSIS:  SAME  PROCEDURE:  ARTHROSCOPY, KNEE  SURGEON:  Cordella SHAUNNA Rhein  ASSISTANT: None  OPERATIVE IMPLANTS:  * No implants in log *  UNIQUE ASPECTS OF THE CASE: None  ESTIMATED BLOOD LOSS: Minimal  PREOPERATIVE INDICATIONS:  Madeline Richmond is a 63 y.o. year old who fell and suffered an septic jointknee. She was brought into the ER and then admitted and optimized and then elected for surgical intervention.   The patient presented seen as an outpatient.  She was treated with an aspiration.  The treating doctor sent to the emergency room for concern of a septic knee.  Upon presentation, the knee was red warm and swollen.  She was noted to have a elevated white cell count in the knee as well as elevated PMNs.  Given the failure conservative care and the concern for possible infection, elected to with arthroscopic debridement  The risks benefits and alternatives were discussed with the patient including but not limited to the risks of nonoperative treatment, versus surgical intervention including infection, bleeding, nerve injury, malunion, nonunion, hardware prominence, hardware failure, need for hardware removal, blood clots, cardiopulmonary complications, morbidity, mortality, among others, and they were willing to proceed.    OPERATIVE PROCEDURE:  The patient was brought to the operating room and placed in the supine position. Anesthesia was administered. She was placed on the the operating room table.  A leg holder was used.  The right leg was prepped and draped in usual sterile fashion.  A timeout procedure was performed.  We held antibiotics until cultures were taken.  We first placed an 18-gauge spinal needle into the knee using the medial infrapatellar portal.  33 cc of thick straw-colored fluid was withdrawn.  This  was sent for Gram stain culture crystals and cell count with differential.  We then made a medial portal with an 11 blade.  The trocar was advanced into the femur and then into the suprapatellar pouch.  The cam was placed.  We brought the camera down into the midline.  We were able to palpate the lateral portal.  An 18-gauge spinal needle was inserted under direct visualization.  We then placed an 11-blade knife to create the lateral portal.  The trocar was advanced.  We then performed a diagnostic arthroscopy.  Significant inflamed synovium was noted throughout the knee.  This was debrided using the shaver.  A full-thickness chondral defect was noted on the patella as well as on the medial femoral condyle.  The lateral condyle and tibial plateau does appear to be intact.  Both medial and lateral meniscus were intact.  The ACL was also intact.  No foreign or loose bodies were noted.  And synovium was debrided with a shaver.  We then copiously irrigated the knee joint using 6 L of fluid.  The shavers and camera was withdrawn.  The wounds were closed with 4-0 nylon.  The wound was then dressed with Xeroform, 4 x 4's, soft roll and Ace wrap.  The patient will come anesthesia and transferred to the recovery room in stable condition  Post op recs: WB: WBAT right LE Abx: ancef  for now, cultures are pending Dressing: keep intact until follow up, change PRN if soiled or saturated. DVT prophylaxis: l aspirin Follow up: 2 weeks after surgery for a wound check with Dr. Rhein at  [facility].  Address: [address], [place], [place] [address]  Office Phone: [phone]  Cordella Rhein, MD, MS [facility] / [place] [phone]

## 2024-01-28 NOTE — Anesthesia Procedure Notes (Signed)
 Procedure Name: Intubation Date/Time: 01/28/2024 5:41 PM  Performed by: Delores Dus, CRNAPre-anesthesia Checklist: Patient identified, Emergency Drugs available, Suction available and Patient being monitored Patient Re-evaluated:Patient Re-evaluated prior to induction Oxygen Delivery Method: Circle system utilized Preoxygenation: Pre-oxygenation with 100% oxygen Induction Type: IV induction Ventilation: Mask ventilation without difficulty Laryngoscope Size: Miller and 2 Grade View: Grade I Tube type: Oral Tube size: 7.0 mm Number of attempts: 1 Airway Equipment and Method: Stylet and Oral airway Placement Confirmation: ETT inserted through vocal cords under direct vision, positive ETCO2 and breath sounds checked- equal and bilateral Secured at: 22 cm Tube secured with: Tape Dental Injury: Teeth and Oropharynx as per pre-operative assessment

## 2024-01-28 NOTE — Anesthesia Postprocedure Evaluation (Signed)
 Anesthesia Post Note  Patient: Madeline Richmond  Procedure(s) Performed: ARTHROSCOPY, KNEE (Right: Knee)     Patient location during evaluation: PACU Anesthesia Type: General Level of consciousness: awake and alert Pain management: pain level controlled Vital Signs Assessment: post-procedure vital signs reviewed and stable Respiratory status: spontaneous breathing, nonlabored ventilation, respiratory function stable and patient connected to nasal cannula oxygen Cardiovascular status: blood pressure returned to baseline and stable Postop Assessment: no apparent nausea or vomiting Anesthetic complications: no   No notable events documented.  Last Vitals:  Vitals:   01/28/24 1945 01/28/24 2136  BP: 115/61 (!) 102/59  Pulse: 67 64  Resp: 14 20  Temp: 36.6 C 36.7 C  SpO2: 97% 97%    Last Pain:  Vitals:   01/28/24 2136  TempSrc: Oral  PainSc:    Pain Goal: Patients Stated Pain Goal: 3 (01/28/24 1900)                 Garnette DELENA Gab

## 2024-01-28 NOTE — Transfer of Care (Signed)
 Immediate Anesthesia Transfer of Care Note  Patient: Madeline Richmond  Procedure(s) Performed: ARTHROSCOPY, KNEE (Right: Knee)  Patient Location: PACU  Anesthesia Type:General  Level of Consciousness: awake, alert , and oriented  Airway & Oxygen Therapy: Patient Spontanous Breathing and Patient connected to nasal cannula oxygen  Post-op Assessment: Report given to RN and Post -op Vital signs reviewed and stable  Post vital signs: Reviewed and stable  Last Vitals:  Vitals Value Taken Time  BP 126/60 01/28/24 18:54  Temp    Pulse 71 01/28/24 18:57  Resp 13 01/28/24 18:57  SpO2 100 % 01/28/24 18:57  Vitals shown include unfiled device data.  Last Pain:  Vitals:   01/28/24 1710  TempSrc: Oral  PainSc:          Complications: No notable events documented.

## 2024-01-28 NOTE — ED Notes (Addendum)
 Back from b/r, bladder emptied, into gown, belongings (clothes/ jewelry) bagged and labeled, calling her husband on her cell phone. R wrist bangle bracelet won't come off. Pending call from OR. Last PO food yesterday 1600. Last PO drink at 0715. Endorses drinking ETOH daily, reports none since Monday.

## 2024-01-28 NOTE — ED Provider Notes (Signed)
 [place] EMERGENCY DEPARTMENT AT [facility] Provider Note   CSN: [id number] Arrival date & time: 01/28/24  9150     Patient presents with: Knee Pain   Madeline Richmond is a 63 y.o. female.   63 year old female with past medical history of GERD, anxiety and seasonal allergies presenting to the emergency department today with right knee pain.  The patient does have arthritis and was seen at [facility] on Thursday of last week.  She did have a steroid injection.  She reports that her pain has gotten worse since then.  She was seen there on Monday and did have a an arthrocentesis performed.  I did speak with Dr. Delane who has been seeing the patient and he did report that there were greater than 50,000 neutrophils and cultures are pending there.  She has been afebrile but has had worsening pain difficulty ambulating on this.  She came to the ER today for further evaluation regarding this.   Knee Pain      Prior to Admission medications   Medication Sig Start Date End Date Taking? Authorizing Provider  doxycycline (VIBRAMYCIN) 100 MG capsule Take 100 mg by mouth 2 (two) times daily. 01/26/24  Yes [provider]  FLUoxetine  (PROZAC ) 40 MG capsule Take 40 mg by mouth in the morning.   Yes [provider]  HYDROcodone -acetaminophen  (NORCO/VICODIN) 5-325 MG tablet Take 1 tablet by mouth every 6 (six) hours as needed for moderate pain (pain score 4-6) or severe pain (pain score 7-10). 01/26/24  Yes [provider]  omeprazole  (PRILOSEC) 20 MG capsule Take 20 mg by mouth daily.   Yes [provider]  SEMAGLUTIDE [place] Inject 40 Units into the skin once a week. 12/17/23  Yes [provider]    Allergies: Codeine camsylate [codeine]    Review of Systems  Musculoskeletal:  Positive for arthralgias.  All other systems reviewed and are negative.   Updated Vital Signs BP (!) 104/59   Pulse 74   Temp 98.6 F (37 C) (Oral)   Resp  17   Ht 5' 7 (1.702 m)   Wt 68 kg   SpO2 99%   BMI 23.49 kg/m   Physical Exam Vitals and nursing note reviewed.   Gen: NAD Eyes: PERRL, EOMI HEENT: no oropharyngeal swelling Neck: trachea midline Resp: clear to auscultation bilaterally Card: RRR, no murmurs, rubs, or gallops Abd: nontender, nondistended Extremities: no calf tenderness, the patient's right knee is swollen and slightly warm to the touch with no overlying erythema noted, the patient does have pain with active and passive range of motion, compartments are soft Vascular: 2+ radial pulses bilaterally, 2+ DP pulses bilaterally Skin: no rashes Psyc: acting appropriately   (all labs ordered are listed, but only abnormal results are displayed) Labs Reviewed  CBC WITH DIFFERENTIAL/PLATELET - Abnormal; Notable for the following components:      Result Value   WBC 12.1 (*)    Neutro Abs 9.6 (*)    Monocytes Absolute 1.2 (*)    All other components within normal limits  BASIC METABOLIC PANEL WITH GFR - Abnormal; Notable for the following components:   Sodium 132 (*)    CO2 20 (*)    Glucose, Bld 105 (*)    Calcium 8.5 (*)    All other components within normal limits  SURGICAL PCR SCREEN  HIV ANTIBODY (ROUTINE TESTING W REFLEX)  URINALYSIS, W/ REFLEX TO CULTURE (INFECTION SUSPECTED)  I-STAT CG4 LACTIC ACID, ED  I-STAT CG4  LACTIC ACID, ED    EKG: None  Radiology: DG Knee Complete 4 Views Right Result Date: 01/28/2024 EXAM: 4 OR MORE VIEW(S) Xray of the right knee 01/28/2024 09:36:00 AM COMPARISON: None available. CLINICAL HISTORY: Pain FINDINGS: BONES AND JOINTS: No acute fracture. No focal osseous lesion. No joint dislocation. No significant joint effusion. No significant degenerative changes. SOFT TISSUES: The soft tissues are unremarkable. IMPRESSION: 1. No significant abnormality. Electronically signed by: Lynwood Seip MD 01/28/2024 09:53 AM EST RP Workstation: [id number]   VAS US  LOWER EXTREMITY VENOUS  (DVT) Result Date: 01/27/2024  Lower Venous DVT Study Patient Name:  Madeline Richmond  Date of Exam:   01/27/2024 Medical Rec #: [id number]            Accession #:    [id number] Date of Birth: 1960-09-17             Patient Gender: F Patient Age:   17 years Exam Location:  [address] Procedure:      VAS US  LOWER EXTREMITY VENOUS (DVT) Referring Phys: EVALENE CITRON --------------------------------------------------------------------------------  Indications: Pain, and Edema. Other Indications: Recent cortizon shot and fluid removal. Performing Technologist: Devere Dark RVT  Examination Guidelines: A complete evaluation includes B-mode imaging, spectral Doppler, color Doppler, and power Doppler as needed of all accessible portions of each vessel. Bilateral testing is considered an integral part of a complete examination. Limited examinations for reoccurring indications may be performed as noted. The reflux portion of the exam is performed with the patient in reverse Trendelenburg.  +---------+---------------+---------+-----------+----------+--------------+ RIGHT    CompressibilityPhasicitySpontaneityPropertiesThrombus Aging +---------+---------------+---------+-----------+----------+--------------+ CFV      Full           Yes      Yes                                 +---------+---------------+---------+-----------+----------+--------------+ SFJ      Full           Yes      Yes                                 +---------+---------------+---------+-----------+----------+--------------+ FV Prox  Full           Yes      Yes                                 +---------+---------------+---------+-----------+----------+--------------+ FV Mid   Full           Yes      Yes                                 +---------+---------------+---------+-----------+----------+--------------+ FV DistalFull           Yes      Yes                                  +---------+---------------+---------+-----------+----------+--------------+ PFV      Full           Yes      Yes                                 +---------+---------------+---------+-----------+----------+--------------+  POP      Full           Yes      Yes                                 +---------+---------------+---------+-----------+----------+--------------+ PTV      Full           Yes      Yes                                 +---------+---------------+---------+-----------+----------+--------------+ PERO     Full           Yes      Yes                                 +---------+---------------+---------+-----------+----------+--------------+ Gastroc  Full           Yes      Yes                                 +---------+---------------+---------+-----------+----------+--------------+ GSV      Full           Yes      Yes                                 +---------+---------------+---------+-----------+----------+--------------+ SSV      Full           Yes      Yes                                 +---------+---------------+---------+-----------+----------+--------------+   +----+---------------+---------+-----------+----------+--------------+ LEFTCompressibilityPhasicitySpontaneityPropertiesThrombus Aging +----+---------------+---------+-----------+----------+--------------+ CFV Full           Yes      Yes                                 +----+---------------+---------+-----------+----------+--------------+   Findings reported to [facility] at 11:30.  Summary: RIGHT: - There is no evidence of deep vein thrombosis in the lower extremity. - There is no evidence of superficial venous thrombosis. - There is no evidence of deep vein thrombosis proximal to the inguinal ligament or in the common femoral vein. NOTE: 3.36 x 1.82 flulid collection lateral right knee   *See table(s) above for measurements and observations. Electronically signed by Lonni Gaskins  MD on 01/27/2024 at 11:38:58 AM.    Final      Procedures   Medications Ordered in the ED  chlorhexidine  (HIBICLENS ) 4 % liquid 4 Application (has no administration in time range)  povidone-iodine 10 % swab 2 Application (has no administration in time range)  enoxaparin (LOVENOX) injection 40 mg (has no administration in time range)  sodium chloride  flush (NS) 0.9 % injection 3 mL (3 mLs Intravenous Given 01/28/24 1421)  acetaminophen  (TYLENOL ) tablet 650 mg (650 mg Oral Given 01/28/24 1425)    Or  acetaminophen  (TYLENOL ) suppository 650 mg ( Rectal See Alternative 01/28/24 1425)  ondansetron  (ZOFRAN ) tablet 4 mg (has no administration in time range)    Or  ondansetron  (ZOFRAN ) injection 4 mg (  has no administration in time range)  albuterol (PROVENTIL) (2.5 MG/3ML) 0.083% nebulizer solution 2.5 mg (has no administration in time range)  LORazepam (ATIVAN) tablet 1-4 mg (has no administration in time range)    Or  LORazepam (ATIVAN) injection 1-4 mg (has no administration in time range)  thiamine (VITAMIN B1) tablet 100 mg (has no administration in time range)    Or  thiamine (VITAMIN B1) injection 100 mg (has no administration in time range)  folic acid (FOLVITE) tablet 1 mg (has no administration in time range)  multivitamin with minerals tablet 1 tablet (has no administration in time range)  FLUoxetine  (PROZAC ) capsule 40 mg (has no administration in time range)  0.9 %  sodium chloride  infusion ( Intravenous New Bag/Given 01/28/24 1420)  calcium gluconate 1 g/ 50 mL sodium chloride  IVPB (has no administration in time range)  morphine  (PF) 2 MG/ML injection 2 mg (2 mg Intravenous Given 01/28/24 1611)  oxyCODONE -acetaminophen  (PERCOCET/ROXICET) 5-325 MG per tablet 1 tablet (has no administration in time range)  morphine  (PF) 4 MG/ML injection 4 mg (4 mg Intravenous Given 01/28/24 1117)  ondansetron  (ZOFRAN ) injection 4 mg (4 mg Intravenous Given 01/28/24 1116)                                     Medical Decision Making 63 year old female with past medical history of GERD, anxiety, and arthritis presenting to the emergency department today with right knee pain and concern for septic arthritis based on outpatient arthrocentesis.  I did speak with Dr. Reyne who does recommend admission and recommends holding off on antibiotics at this time.  Plan is for washout today or tomorrow in the OR.  Will give patient morphine  Zofran  for pain.  Amount and/or Complexity of Data Reviewed Labs: ordered. Radiology: ordered.  Risk Prescription drug management. Decision regarding hospitalization.        Final diagnoses:  Septic arthritis, due to unspecified organism, septic arthritis of unspecified location [facility])    ED Discharge Orders     None          Ula Prentice SAUNDERS, MD 01/28/24 1622

## 2024-01-28 NOTE — ED Notes (Signed)
 Family at [facility]. Up to floor.

## 2024-01-28 NOTE — ED Notes (Signed)
 Called OR, no specific timeframe, pt is add on, pending possible time around 1600. Remains NPO.

## 2024-01-28 NOTE — ED Notes (Signed)
 Up to b/r by w/c, prepping for OR, into gown

## 2024-01-28 NOTE — ED Triage Notes (Signed)
 Pt. Stated, Ive had fluid on my Rt. Knee since Monday. I had a cortisone shot in there last Thursday and they drew out 65 cc and Ive been in pain for the last 3 days.

## 2024-01-28 NOTE — Consult Note (Addendum)
 Reason for Consult:Right knee septic joint Referring Physician: Prentice Medicus Time called: 1017 Time at bedside: 1024   Madeline Richmond is an 63 y.o. female.  HPI: Auria developed right knee pain Saturday night. It quickly progressed in severity. She underwent knee tap at [facility] where analysis showed 68k WBC with 90% neutrophils c/w septic joint and she was directed to come to the ED for admission and arthroscopy. She underwent cortisone injection on Thursday for ongoing knee pain.  Past Medical History:  Diagnosis Date   Anxiety    Arthritis    knee   Depression    Eczema    GERD (gastroesophageal reflux disease)    H/O seasonal allergies     Past Surgical History:  Procedure Laterality Date   BREAST EXCISIONAL BIOPSY Left 1996   benign   CHOLECYSTECTOMY N/A 05/09/2023   Procedure: LAPAROSCOPIC CHOLECYSTECTOMY;  Surgeon: Vernetta Berg, MD;  Location: [facility] OR;  Service: General;  Laterality: N/A;   COLONOSCOPY     EYE SURGERY     strabismus   REFRACTIVE SURGERY      Family History  Problem Relation Age of Onset   Arthritis Mother    Prostate cancer Father 32       metastatic   Drug abuse Father    Hypertension Father    Breast cancer Sister 12   Melanoma Brother     Social History:  reports that she has never smoked. She has never used smokeless tobacco. She reports current alcohol use of about 7.0 standard drinks of alcohol per week. She reports that she does not use drugs.  Allergies:  Allergies  Allergen Reactions   Codeine Camsylate [Codeine] Nausea And Vomiting    Medications: I have reviewed the patient's current medications.  No results found for this or any previous visit (from the past 48 hours).  DG Knee Complete 4 Views Right Result Date: 01/28/2024 EXAM: 4 OR MORE VIEW(S) Xray of the right knee 01/28/2024 09:36:00 AM COMPARISON: None available. CLINICAL HISTORY: Pain FINDINGS: BONES AND JOINTS: No acute fracture. No focal osseous lesion. No joint  dislocation. No significant joint effusion. No significant degenerative changes. SOFT TISSUES: The soft tissues are unremarkable. IMPRESSION: 1. No significant abnormality. Electronically signed by: Lynwood Seip MD 01/28/2024 09:53 AM EST RP Workstation: [id number]   VAS US  LOWER EXTREMITY VENOUS (DVT) Result Date: 01/27/2024  Lower Venous DVT Study Patient Name:  Celenia Hruska  Date of Exam:   01/27/2024 Medical Rec #: [id number]            Accession #:    [id number] Date of Birth: 1960-04-05             Patient Gender: F Patient Age:   42 years Exam Location:  [address] Procedure:      VAS US  LOWER EXTREMITY VENOUS (DVT) Referring Phys: EVALENE CITRON --------------------------------------------------------------------------------  Indications: Pain, and Edema. Other Indications: Recent cortizon shot and fluid removal. Performing Technologist: Devere Dark RVT  Examination Guidelines: A complete evaluation includes B-mode imaging, spectral Doppler, color Doppler, and power Doppler as needed of all accessible portions of each vessel. Bilateral testing is considered an integral part of a complete examination. Limited examinations for reoccurring indications may be performed as noted. The reflux portion of the exam is performed with the patient in reverse Trendelenburg.  +---------+---------------+---------+-----------+----------+--------------+ RIGHT    CompressibilityPhasicitySpontaneityPropertiesThrombus Aging +---------+---------------+---------+-----------+----------+--------------+ CFV      Full           Yes  Yes                                 +---------+---------------+---------+-----------+----------+--------------+ SFJ      Full           Yes      Yes                                 +---------+---------------+---------+-----------+----------+--------------+ FV Prox  Full           Yes      Yes                                  +---------+---------------+---------+-----------+----------+--------------+ FV Mid   Full           Yes      Yes                                 +---------+---------------+---------+-----------+----------+--------------+ FV DistalFull           Yes      Yes                                 +---------+---------------+---------+-----------+----------+--------------+ PFV      Full           Yes      Yes                                 +---------+---------------+---------+-----------+----------+--------------+ POP      Full           Yes      Yes                                 +---------+---------------+---------+-----------+----------+--------------+ PTV      Full           Yes      Yes                                 +---------+---------------+---------+-----------+----------+--------------+ PERO     Full           Yes      Yes                                 +---------+---------------+---------+-----------+----------+--------------+ Gastroc  Full           Yes      Yes                                 +---------+---------------+---------+-----------+----------+--------------+ GSV      Full           Yes      Yes                                 +---------+---------------+---------+-----------+----------+--------------+ SSV      Full  Yes      Yes                                 +---------+---------------+---------+-----------+----------+--------------+   +----+---------------+---------+-----------+----------+--------------+ LEFTCompressibilityPhasicitySpontaneityPropertiesThrombus Aging +----+---------------+---------+-----------+----------+--------------+ CFV Full           Yes      Yes                                 +----+---------------+---------+-----------+----------+--------------+   Findings reported to [facility] at 11:30.  Summary: RIGHT: - There is no evidence of deep vein thrombosis in the lower extremity. - There is no  evidence of superficial venous thrombosis. - There is no evidence of deep vein thrombosis proximal to the inguinal ligament or in the common femoral vein. NOTE: 3.36 x 1.82 flulid collection lateral right knee   *See table(s) above for measurements and observations. Electronically signed by Lonni Gaskins MD on 01/27/2024 at 11:38:58 AM.    Final     Review of Systems  HENT:  Negative for ear discharge, ear pain, hearing loss and tinnitus.   Eyes:  Negative for photophobia and pain.  Respiratory:  Negative for cough and shortness of breath.   Cardiovascular:  Negative for chest pain.  Gastrointestinal:  Negative for abdominal pain, nausea and vomiting.  Genitourinary:  Negative for dysuria, flank pain, frequency and urgency.  Musculoskeletal:  Positive for arthralgias (Right knee). Negative for back pain, myalgias and neck pain.  Neurological:  Negative for dizziness and headaches.  Hematological:  Does not bruise/bleed easily.  Psychiatric/Behavioral:  The patient is not nervous/anxious.    Blood pressure 132/68, pulse 97, temperature 99 F (37.2 C), resp. rate 19, height 5' 7 (1.702 m), weight 68 kg, SpO2 100%. Physical Exam Constitutional:      General: She is not in acute distress.    Appearance: She is well-developed. She is not diaphoretic.  HENT:     Head: Normocephalic and atraumatic.  Eyes:     General: No scleral icterus.       Right eye: No discharge.        Left eye: No discharge.     Conjunctiva/sclera: Conjunctivae normal.  Cardiovascular:     Rate and Rhythm: Normal rate and regular rhythm.  Pulmonary:     Effort: Pulmonary effort is normal. No respiratory distress.  Musculoskeletal:     Cervical back: Normal range of motion.     Comments: RLE No traumatic wounds, ecchymosis, or rash  Mod TTP knee, severe pain with A/PROM  Mod knee effusion  Sens DPN, SPN, TN intact  Motor EHL, ext, flex, evers 5/5  DP 2+, PT 2+, No significant edema  Skin:    General: Skin  is warm and dry.  Neurological:     Mental Status: She is alert.  Psychiatric:        Mood and Affect: Mood normal.        Behavior: Behavior normal.     Assessment/Plan: Right knee septic joint -- Plan I&D this afternoon with Dr. Reyne. Please keep NPO and do not start abx until after surgery.    Ozell DOROTHA Ned, PA-C Orthopedic Surgery [phone] 01/28/2024, 10:35 AM   I examined the patient in the preoperative holding area.  Briefly she has had knee pain for over 2 weeks.  Severe debilitating which she cannot walk.  She received a cortisone shot  which was not effective and her pain became worse.  She was noted then to have an increased effusion.  This is aspirated and there was concern for possible infection prompting her admission here to the hospital.  The aspiration done in the [facility] office showed a white cell count of 68,000.  There is 90% polys.  No crystals.  The Gram stain was negative.  However given that the severe pain she is having as well as the elevated white cells and PMNs, I am worried about the possibly of infection.  We discussed options including repeat aspiration versus a surgical exploration debridement.  This will be done arthroscopically.  Again given the severe pain and the findings on the imaging studies is what I would recommend.  We discussed risks of procedure include but limited bleeding, if pyrazole infection, developing new infection, injury to the cartilage or knee joint and, and the failure resolve symptoms.  We discussed taking cultures in the operating room and then starting antibiotics based on the findings.  Informed signs obtained.  The right leg was marked.

## 2024-01-29 ENCOUNTER — Encounter (HOSPITAL_COMMUNITY): Payer: Self-pay

## 2024-01-29 DIAGNOSIS — W19XXXA Unspecified fall, initial encounter: Secondary | ICD-10-CM | POA: Diagnosis present

## 2024-01-29 DIAGNOSIS — F32A Depression, unspecified: Secondary | ICD-10-CM | POA: Diagnosis present

## 2024-01-29 DIAGNOSIS — K219 Gastro-esophageal reflux disease without esophagitis: Secondary | ICD-10-CM | POA: Diagnosis present

## 2024-01-29 DIAGNOSIS — Z7401 Bed confinement status: Secondary | ICD-10-CM | POA: Diagnosis not present

## 2024-01-29 DIAGNOSIS — L309 Dermatitis, unspecified: Secondary | ICD-10-CM | POA: Diagnosis present

## 2024-01-29 DIAGNOSIS — B9561 Methicillin susceptible Staphylococcus aureus infection as the cause of diseases classified elsewhere: Secondary | ICD-10-CM | POA: Diagnosis present

## 2024-01-29 DIAGNOSIS — R21 Rash and other nonspecific skin eruption: Secondary | ICD-10-CM | POA: Diagnosis not present

## 2024-01-29 DIAGNOSIS — M009 Pyogenic arthritis, unspecified: Secondary | ICD-10-CM | POA: Diagnosis not present

## 2024-01-29 DIAGNOSIS — F419 Anxiety disorder, unspecified: Secondary | ICD-10-CM | POA: Diagnosis present

## 2024-01-29 DIAGNOSIS — M25561 Pain in right knee: Secondary | ICD-10-CM | POA: Diagnosis present

## 2024-01-29 DIAGNOSIS — Z8261 Family history of arthritis: Secondary | ICD-10-CM | POA: Diagnosis not present

## 2024-01-29 DIAGNOSIS — Z7985 Long-term (current) use of injectable non-insulin antidiabetic drugs: Secondary | ICD-10-CM | POA: Diagnosis not present

## 2024-01-29 DIAGNOSIS — M00061 Staphylococcal arthritis, right knee: Secondary | ICD-10-CM | POA: Diagnosis present

## 2024-01-29 DIAGNOSIS — M1990 Unspecified osteoarthritis, unspecified site: Secondary | ICD-10-CM | POA: Diagnosis present

## 2024-01-29 DIAGNOSIS — Z8249 Family history of ischemic heart disease and other diseases of the circulatory system: Secondary | ICD-10-CM | POA: Diagnosis not present

## 2024-01-29 DIAGNOSIS — K5900 Constipation, unspecified: Secondary | ICD-10-CM | POA: Diagnosis not present

## 2024-01-29 DIAGNOSIS — E871 Hypo-osmolality and hyponatremia: Secondary | ICD-10-CM | POA: Diagnosis present

## 2024-01-29 LAB — CBC
HCT: 34.7 % — ABNORMAL LOW (ref 36.0–46.0)
Hemoglobin: 11.8 g/dL — ABNORMAL LOW (ref 12.0–15.0)
MCH: 30.4 pg (ref 26.0–34.0)
MCHC: 34 g/dL (ref 30.0–36.0)
MCV: 89.4 fL (ref 80.0–100.0)
Platelets: 266 K/uL (ref 150–400)
RBC: 3.88 MIL/uL (ref 3.87–5.11)
RDW: 11.9 % (ref 11.5–15.5)
WBC: 8.6 K/uL (ref 4.0–10.5)
nRBC: 0 % (ref 0.0–0.2)

## 2024-01-29 LAB — BASIC METABOLIC PANEL WITH GFR
Anion gap: 9 (ref 5–15)
BUN: 14 mg/dL (ref 8–23)
CO2: 25 mmol/L (ref 22–32)
Calcium: 8.5 mg/dL — ABNORMAL LOW (ref 8.9–10.3)
Chloride: 98 mmol/L (ref 98–111)
Creatinine, Ser: 0.71 mg/dL (ref 0.44–1.00)
GFR, Estimated: >60 mL/min (ref >60)
Glucose, Bld: 154 mg/dL — ABNORMAL HIGH (ref 70–99)
Potassium: 4.2 mmol/L (ref 3.5–5.1)
Sodium: 132 mmol/L — ABNORMAL LOW (ref 135–145)

## 2024-01-29 LAB — PATHOLOGIST SMEAR REVIEW

## 2024-01-29 LAB — GLUCOSE, CAPILLARY
Glucose-Capillary: 140 mg/dL — ABNORMAL HIGH (ref 70–99)
Glucose-Capillary: 127 mg/dL — ABNORMAL HIGH (ref 70–99)

## 2024-01-29 MED ORDER — CYCLOBENZAPRINE HCL 5 MG PO TABS
5.0000 mg | ORAL_TABLET | Freq: Three times a day (TID) | ORAL | Status: DC | PRN
Start: 2024-01-29 — End: 2024-02-03
  Administered 2024-01-29 – 2024-02-01 (×6): 5 mg via ORAL
  Filled 2024-01-29 (×6): qty 1

## 2024-01-29 NOTE — Progress Notes (Signed)
 Progress Note   Patient: Madeline Richmond FMW:[id number] DOB: May 22, 1960 DOA: 01/28/2024  DOS: the patient was seen and examined on 01/29/2024   Brief hospital course:  63 y.o. female with medical history significant of anxiety, depression, and alcohol use presents with right knee pain and swelling following a cortisone injection.   Assessment and Plan:  Concern for right knee septic arthritis - Given recent outpatient cortisone knee injection as inciting factor, marked leukocytosis on initial arthrocentesis, concern for septic arthritis.  Orthopedic surgery consulted and following closely.  S/p I&D 11/12.  Repeat arthrocentesis with improved leukocytosis and neutrophil count in the upper 90s.  Still equivocal for infectious versus inflammatory.  Hedging with empiric cefazolin  for the time being.  PT pending.  Await culture.  Mild hyponatremia - Likely secondary to decreased p.o. intake.  Appears resolved.  Anxiety/depression - Prozac  on board.  GERD - Prilosec on board.    Subjective: Patient resting comfortably this morning.  Still having knee pain as to be expected.  Denies any fever, chills, chest pain, shortness of breath, nausea, vomiting.  Physical Exam:  Vitals:   01/28/24 2136 01/28/24 2326 01/29/24 0400 01/29/24 0736  BP: (!) 102/59 (!) 103/55 (!) 103/55 124/64  Pulse: 64 61 63 68  Resp: 20 20 20 20   Temp: 98.1 F (36.7 C) (!) 97.5 F (36.4 C) (!) 97.5 F (36.4 C) 98 F (36.7 C)  TempSrc: Oral Oral Oral   SpO2: 97% 97% 98% 99%  Weight:      Height:        GENERAL:  Alert, pleasant, no acute distress  HEENT:  EOMI CARDIOVASCULAR:  RRR, no murmurs appreciated RESPIRATORY:  Clear to auscultation, no wheezing, rales, or rhonchi GASTROINTESTINAL:  Soft, nontender, nondistended EXTREMITIES:  No LE edema bilaterally NEURO:  No new focal deficits appreciated SKIN:  No rashes noted PSYCH:  Appropriate mood and affect     Data Reviewed:  Imaging  Studies: DG Knee Complete 4 Views Right Result Date: 01/28/2024 EXAM: 4 OR MORE VIEW(S) Xray of the right knee 01/28/2024 09:36:00 AM COMPARISON: None available. CLINICAL HISTORY: Pain FINDINGS: BONES AND JOINTS: No acute fracture. No focal osseous lesion. No joint dislocation. No significant joint effusion. No significant degenerative changes. SOFT TISSUES: The soft tissues are unremarkable. IMPRESSION: 1. No significant abnormality. Electronically signed by: Lynwood Seip MD 01/28/2024 09:53 AM EST RP Workstation: [id number]   VAS US  LOWER EXTREMITY VENOUS (DVT) Result Date: 01/27/2024  Lower Venous DVT Study Patient Name:  Jammy Stlouis  Date of Exam:   01/27/2024 Medical Rec #: [id number]            Accession #:    [id number] Date of Birth: 1960/10/27             Patient Gender: F Patient Age:   37 years Exam Location:  [address] Procedure:      VAS US  LOWER EXTREMITY VENOUS (DVT) Referring Phys: EVALENE CITRON --------------------------------------------------------------------------------  Indications: Pain, and Edema. Other Indications: Recent cortizon shot and fluid removal. Performing Technologist: Devere Dark RVT  Examination Guidelines: A complete evaluation includes B-mode imaging, spectral Doppler, color Doppler, and power Doppler as needed of all accessible portions of each vessel. Bilateral testing is considered an integral part of a complete examination. Limited examinations for reoccurring indications may be performed as noted. The reflux portion of the exam is performed with the patient in reverse Trendelenburg.  +---------+---------------+---------+-----------+----------+--------------+ RIGHT    CompressibilityPhasicitySpontaneityPropertiesThrombus Aging +---------+---------------+---------+-----------+----------+--------------+ CFV      Full  Yes      Yes                                  +---------+---------------+---------+-----------+----------+--------------+ SFJ      Full           Yes      Yes                                 +---------+---------------+---------+-----------+----------+--------------+ FV Prox  Full           Yes      Yes                                 +---------+---------------+---------+-----------+----------+--------------+ FV Mid   Full           Yes      Yes                                 +---------+---------------+---------+-----------+----------+--------------+ FV DistalFull           Yes      Yes                                 +---------+---------------+---------+-----------+----------+--------------+ PFV      Full           Yes      Yes                                 +---------+---------------+---------+-----------+----------+--------------+ POP      Full           Yes      Yes                                 +---------+---------------+---------+-----------+----------+--------------+ PTV      Full           Yes      Yes                                 +---------+---------------+---------+-----------+----------+--------------+ PERO     Full           Yes      Yes                                 +---------+---------------+---------+-----------+----------+--------------+ Gastroc  Full           Yes      Yes                                 +---------+---------------+---------+-----------+----------+--------------+ GSV      Full           Yes      Yes                                 +---------+---------------+---------+-----------+----------+--------------+ SSV  Full           Yes      Yes                                 +---------+---------------+---------+-----------+----------+--------------+   +----+---------------+---------+-----------+----------+--------------+ LEFTCompressibilityPhasicitySpontaneityPropertiesThrombus Aging  +----+---------------+---------+-----------+----------+--------------+ CFV Full           Yes      Yes                                 +----+---------------+---------+-----------+----------+--------------+   Findings reported to [facility] at 11:30.  Summary: RIGHT: - There is no evidence of deep vein thrombosis in the lower extremity. - There is no evidence of superficial venous thrombosis. - There is no evidence of deep vein thrombosis proximal to the inguinal ligament or in the common femoral vein. NOTE: 3.36 x 1.82 flulid collection lateral right knee   *See table(s) above for measurements and observations. Electronically signed by Lonni Gaskins MD on 01/27/2024 at 11:38:58 AM.    Final     Results are pending, will review when available.  Previous records (including but not limited to H&P, progress notes, nursing notes, TOC management) were reviewed in assessment of this patient.  Labs: CBC: Recent Labs  Lab 01/28/24 1121 01/29/24 0525  WBC 12.1* 8.6  NEUTROABS 9.6*  --   HGB 12.3 11.8*  HCT 37.6 34.7*  MCV 91.7 89.4  PLT 240 266   Basic Metabolic Panel: Recent Labs  Lab 01/28/24 1121 01/29/24 0525  NA 132* 132*  K 3.8 4.2  CL 98 98  CO2 20* 25  GLUCOSE 105* 154*  BUN 14 14  CREATININE 0.66 0.71  CALCIUM 8.5* 8.5*   Liver Function Tests: No results for input(s): AST, ALT, ALKPHOS, BILITOT, PROT, ALBUMIN in the last 168 hours. CBG: Recent Labs  Lab 01/29/24 0735  GLUCAP 140*    Scheduled Meds:  enoxaparin (LOVENOX) injection  40 mg Subcutaneous Q24H   FLUoxetine   40 mg Oral q AM   folic acid  1 mg Oral Daily   multivitamin with minerals  1 tablet Oral Daily   sodium chloride  flush  3 mL Intravenous Q12H   thiamine  100 mg Oral Daily   Or   thiamine  100 mg Intravenous Daily   Continuous Infusions:  calcium gluconate      ceFAZolin  (ANCEF ) IV 1 g (01/29/24 0652)   PRN Meds:.albuterol, cyclobenzaprine, LORazepam **OR** LORazepam, morphine   injection, oxyCODONE -acetaminophen   Family Communication: Not at bedside  Disposition: Status is: Inpatient Remains inpatient appropriate because: Possible septic arthritis     Time spent: 33 minutes  Length of inpatient stay: 0 days  Author: Carliss LELON Canales, DO 01/29/2024 12:42 PM  For on call review [email or web address].

## 2024-01-29 NOTE — Plan of Care (Signed)
 Patient calm and cooperative arrived back from surgery A&O X4, husband at bedside. Right knee wrapped in ace. PIV maintained. Patient got up to bedside commode. Patient left resting peacefully with respirations at 18.  Problem: Education: Goal: Knowledge of General Education information will improve Description: Including pain rating scale, medication(s)/side effects and non-pharmacologic comfort measures Outcome: Progressing   Problem: Health Behavior/Discharge Planning: Goal: Ability to manage health-related needs will improve Outcome: Progressing   Problem: Clinical Measurements: Goal: Ability to maintain clinical measurements within normal limits will improve Outcome: Progressing   Problem: Activity: Goal: Risk for activity intolerance will decrease Outcome: Progressing   Problem: Nutrition: Goal: Adequate nutrition will be maintained Outcome: Progressing   Problem: Coping: Goal: Level of anxiety will decrease Outcome: Progressing   Problem: Pain Managment: Goal: General experience of comfort will improve and/or be controlled Outcome: Progressing   Problem: Safety: Goal: Ability to remain free from injury will improve Outcome: Progressing   Problem: Skin Integrity: Goal: Risk for impaired skin integrity will decrease Outcome: Progressing

## 2024-01-29 NOTE — Evaluation (Signed)
 Physical Therapy Evaluation Patient Details Name: Madeline Richmond MRN: [id number] DOB: May 30, 1960 Today's Date: 01/29/2024  History of Present Illness  Pt is a 63 y/o female admitted with R knee pain and swelling following a corisone injection.  Pt s/p arthroscopy of L knee.  PMHx:  anxiety, depression, cholectystectomy,  Clinical Impression  Pt admitted with/for swollen, possibly septic knee.  Pt generally Supervision to mod I , but painful, though improved.  Pt currently limited functionally due to the problems listed below.  (see problems list.)  Pt will benefit from PT to maximize function and safety to be able to get home safely with available assist.         If plan is discharge home, recommend the following: A little help with walking and/or transfers;A little help with bathing/dressing/bathroom;Assistance with cooking/housework;Assist for transportation (PRN assist)   Can travel by private vehicle        Equipment Recommendations None recommended by PT  Recommendations for Other Services       Functional Status Assessment Patient has had a recent decline in their functional status and demonstrates the ability to make significant improvements in function in a reasonable and predictable amount of time.     Precautions / Restrictions Precautions Precautions: Fall (lower fall risk) Recall of Precautions/Restrictions: Intact      Mobility  Bed Mobility Overal bed mobility: Modified Independent                  Transfers Overall transfer level: Modified independent                      Ambulation/Gait Ambulation/Gait assistance: Supervision Gait Distance (Feet): 100 Feet Assistive device: None (rail as needed) Gait Pattern/deviations: Step-through pattern   Gait velocity interpretation: <1.31 ft/sec, indicative of household ambulator   General Gait Details: short, mildly painful steps on the right with short stride length and tentative appearing  overall.  Stairs            Wheelchair Mobility     Tilt Bed    Modified Rankin (Stroke Patients Only)       Balance Overall balance assessment: Mild deficits observed, not formally tested                                           Pertinent Vitals/Pain Pain Assessment Pain Assessment: Faces Faces Pain Scale: Hurts little more Pain Location: R knee Pain Descriptors / Indicators: Aching, Sore, Guarding Pain Intervention(s): Monitored during session, Ice applied    Home Living Family/patient expects to be discharged to:: Private residence Living Arrangements: Spouse/significant other Available Help at Discharge: Family;Available PRN/intermittently Type of Home: House Home Access: Stairs to enter Entrance Stairs-Rails: Right;Left     Home Layout: Able to live on main level with bedroom/bathroom Home Equipment: Agricultural Consultant (2 wheels);Cane - single point;BSC/3in1      Prior Function Prior Level of Function : Independent/Modified Independent                     Extremity/Trunk Assessment   Upper Extremity Assessment Upper Extremity Assessment: Overall WFL for tasks assessed    Lower Extremity Assessment Lower Extremity Assessment: Overall WFL for tasks assessed;RLE deficits/detail (Knee ROM limited to 60 degree flexion) RLE Coordination: decreased fine motor    Cervical / Trunk Assessment Cervical / Trunk Assessment: Normal  Communication  Communication Communication: No apparent difficulties    Cognition Arousal: Alert Behavior During Therapy: WFL for tasks assessed/performed   PT - Cognitive impairments: No apparent impairments                         Following commands: Intact       Cueing Cueing Techniques: Verbal cues     General Comments General comments (skin integrity, edema, etc.): VSS    Exercises Other Exercises Other Exercises: worked of hip/knee flexion with support in hip flexion with  gravity assisted knee flexion. x7 on the Right   Assessment/Plan    PT Assessment Patient needs continued PT services  PT Problem List Decreased activity tolerance;Decreased mobility;Pain;Decreased knowledge of use of DME       PT Treatment Interventions DME instruction;Gait training;Stair training;Functional mobility training;Therapeutic activities;Patient/family education;Therapeutic exercise    PT Goals (Current goals can be found in the Care Plan section)  Acute Rehab PT Goals Patient Stated Goal: home tomorrow PT Goal Formulation: With patient Time For Goal Achievement: 01/31/24 Potential to Achieve Goals: Good    Frequency Min 2X/week     Co-evaluation               AM-PAC PT 6 Clicks Mobility  Outcome Measure Help needed turning from your back to your side while in a flat bed without using bedrails?: None Help needed moving from lying on your back to sitting on the side of a flat bed without using bedrails?: None Help needed moving to and from a bed to a chair (including a wheelchair)?: None Help needed standing up from a chair using your arms (e.g., wheelchair or bedside chair)?: None Help needed to walk in hospital room?: A Little Help needed climbing 3-5 steps with a railing? : A Little 6 Click Score: 22    End of Session   Activity Tolerance: Patient tolerated treatment well;Patient limited by pain Patient left: in bed;with call bell/phone within reach Nurse Communication: Mobility status PT Visit Diagnosis: Pain;Difficulty in walking, not elsewhere classified (R26.2) Pain - Right/Left: Right Pain - part of body: Knee    Time: 8965-8897 PT Time Calculation (min) (ACUTE ONLY): 28 min   Charges:   PT Evaluation $PT Eval Low Complexity: 1 Low PT Treatments $Gait Training: 8-22 mins PT General Charges $$ ACUTE PT VISIT: 1 Visit         01/29/2024  India HERO., PT Acute Rehabilitation Services [phone]  (office)  Vinie GAILS  Riva Sesma 01/29/2024, 3:24 PM

## 2024-01-29 NOTE — Progress Notes (Signed)
     Subjective:  Patient reports pain as moderate.  Is localized just to the right knee.  She does report some spasms in the right thigh.  Yesterday's total administered Morphine  Milligram Equivalents: 88   Objective:   VITALS:   Vitals:   01/28/24 1945 01/28/24 2136 01/28/24 2326 01/29/24 0400  BP: 115/61 (!) 102/59 (!) 103/55 (!) 103/55  Pulse: 67 64 61 63  Resp: 14 20 20 20   Temp: 97.9 F (36.6 C) 98.1 F (36.7 C) (!) 97.5 F (36.4 C) (!) 97.5 F (36.4 C)  TempSrc:  Oral Oral Oral  SpO2: 97% 97% 97% 98%  Weight:      Height:        Well-appearing woman in no acute distress.  Clean bandage on the right knee.  No calf tenderness.  Intact sensation in the saphenous, sural, tibial, and peroneal nerve distributions.  5 out of 5 strength in the EHL, FHL, gastrocs, and tibialis anterior  Lab Results  Component Value Date   WBC 8.6 01/29/2024   HGB 11.8 (L) 01/29/2024   HCT 34.7 (L) 01/29/2024   MCV 89.4 01/29/2024   PLT 266 01/29/2024   BMET    Component Value Date/Time   NA 132 (L) 01/29/2024 0525   K 4.2 01/29/2024 0525   CL 98 01/29/2024 0525   CO2 25 01/29/2024 0525   GLUCOSE 154 (H) 01/29/2024 0525   BUN 14 01/29/2024 0525   CREATININE 0.71 01/29/2024 0525   CALCIUM 8.5 (L) 01/29/2024 0525   GFRNONAA >60 01/29/2024 0525    Cell count shows 20,000 white cells, 97% percent polys, no crystals.  Gram stain shows no growth to date.  Cultures are pending    Assessment/Plan: 1 Day Post-Op   Principal Problem:   Septic arthritis (HCC) Active Problems:   GERD (gastroesophageal reflux disease)   Anxiety and depression   Leukocytosis   Hyponatremia   The patient is resting comfortably 1 day after surgery.  Analysis of her cell count is mixed.  The number of white cells is significantly better than it was a few days ago when the sample was taken as an outpatient however the number of neutrophils has increased.  From her gross observation, the fluid appears  to be more like an inflammatory arthropathy rather than infectious however at this point I cannot say definitively and given the concern for infection, I think at this point is still better to treat her as a presumptive infection.  Will continue with IV antibiotics.  Will await the results of the cultures.  If the cultures do not show any growth in the next day or 2, we can presume this to be more inflammatory in nature and potentially treated as an outpatient with anti-inflammatory medications.  However if the cultures do come back positive, she will likely need longer-term antibiotics.  Will keep her in the hospital least through a day or 2 to wait for culture results.  I will discuss this further with the medical team.    Cordella SHAUNNA Rhein 01/29/2024, 7:15 AM   Cordella Rhein, MD, MS [facility] / [place] [phone]   Contact information:   Tzzxijbd 7am-5pm epic message Dr. Rhein, or call office for patient follow up: [phone] After hours and holidays please check [email or web address] for group call information for [facility]

## 2024-01-29 NOTE — Progress Notes (Signed)
 Transition of Care [facility]) - Inpatient Brief Assessment   Patient Details  Name: Madeline Richmond MRN: [id number] Date of Birth: [birth year]  [facility] CM/SW Contact:    Rosaline JONELLE Joe, RN Phone Number: 01/29/2024, 3:26 PM   Clinical Narrative: CM met with the patient at the bedside and she states that she was able to mobilize with PT without difficulty today.  DME at the home includes Rw, Cane and WC.SABRA  Patient states that she occasionally drinks wine and declines smoking nor drug use.  SU resources declined.  No other IP Care management needs.  Patient plans to discharge home by car when stable - by family car tomorrow - cultures remain pending.   Transition of Care Asessment: Insurance and Status: (P) Insurance coverage has been reviewed Patient has primary care physician: (P) Yes Home environment has been reviewed: (P) from home with spouse Prior level of function:: (P) self Prior/Current Home Services: (P) No current home services Social Drivers of Health Review: (P) SDOH reviewed no interventions necessary Readmission risk has been reviewed: (P) Yes Transition of care needs: (P) transition of care needs identified, TOC will continue to follow

## 2024-01-29 NOTE — Hospital Course (Signed)
 63 y.o. female with medical history significant of anxiety, depression, and alcohol use presents with right knee pain and swelling following a cortisone injection.    Assessment and Plan:   Concern for right knee septic arthritis - Given recent outpatient cortisone knee injection as inciting factor, marked leukocytosis on initial arthrocentesis, concern for septic arthritis.  Orthopedic surgery consulted and following closely.  S/p I&D 11/12.  Repeat arthrocentesis with improved leukocytosis and neutrophil count in the upper 90s.  Still equivocal for infectious versus inflammatory.  Hedging with empiric cefazolin  for the time being.  PT pending.  Await culture.   Mild hyponatremia - Likely secondary to decreased p.o. intake.  Appears resolved.   Anxiety/depression - Prozac  on board.   GERD - Prilosec on board.

## 2024-01-29 NOTE — Care Management Obs Status (Signed)
 MEDICARE OBSERVATION STATUS NOTIFICATION   Patient Details  Name: Madeline Richmond MRN: [id number] Date of Birth: Dec 11, 1960   Medicare Observation Status Notification Given:  Yes  Obs notice signed and copy given.  Sophia Sperry 01/29/2024, 10:10 AM

## 2024-01-30 ENCOUNTER — Other Ambulatory Visit: Payer: Self-pay

## 2024-01-30 DIAGNOSIS — M00061 Staphylococcal arthritis, right knee: Secondary | ICD-10-CM

## 2024-01-30 LAB — BASIC METABOLIC PANEL WITH GFR
Anion gap: 9 (ref 5–15)
BUN: 11 mg/dL (ref 8–23)
CO2: 27 mmol/L (ref 22–32)
Calcium: 8.6 mg/dL — ABNORMAL LOW (ref 8.9–10.3)
Chloride: 105 mmol/L (ref 98–111)
Creatinine, Ser: 0.7 mg/dL (ref 0.44–1.00)
GFR, Estimated: >60 mL/min (ref >60)
Glucose, Bld: 98 mg/dL (ref 70–99)
Potassium: 4.6 mmol/L (ref 3.5–5.1)
Sodium: 141 mmol/L (ref 135–145)

## 2024-01-30 LAB — CBC
HCT: 32.1 % — ABNORMAL LOW (ref 36.0–46.0)
Hemoglobin: 10.7 g/dL — ABNORMAL LOW (ref 12.0–15.0)
MCH: 30 pg (ref 26.0–34.0)
MCHC: 33.3 g/dL (ref 30.0–36.0)
MCV: 89.9 fL (ref 80.0–100.0)
Platelets: 278 K/uL (ref 150–400)
RBC: 3.57 MIL/uL — ABNORMAL LOW (ref 3.87–5.11)
RDW: 12 % (ref 11.5–15.5)
WBC: 8.5 K/uL (ref 4.0–10.5)
nRBC: 0 % (ref 0.0–0.2)

## 2024-01-30 MED ORDER — VANCOMYCIN HCL 1500 MG/300ML IV SOLN
1500.0000 mg | INTRAVENOUS | Status: DC
  Administered 2024-01-30 – 2024-01-31 (×2): 1500 mg via INTRAVENOUS
  Filled 2024-01-30 (×2): qty 300

## 2024-01-30 MED ORDER — VANCOMYCIN HCL IN DEXTROSE 1-5 GM/200ML-% IV SOLN: 1000.0000 mg | Freq: Two times a day (BID) | INTRAVENOUS | Status: DC

## 2024-01-30 MED ORDER — CHLORHEXIDINE GLUCONATE CLOTH 2 % EX PADS
6.0000 | MEDICATED_PAD | Freq: Every day | CUTANEOUS | Status: DC
  Administered 2024-01-31 – 2024-02-02 (×3): 6 via TOPICAL

## 2024-01-30 MED ORDER — SODIUM CHLORIDE 0.9% FLUSH: 10.0000 mL | INTRAVENOUS | Status: DC | PRN

## 2024-01-30 NOTE — Consult Note (Signed)
 [facility]    Date of Admission:  01/28/2024     Total days of antibiotics 3               Reason for Consult: Septic arthritis   Referring Provider: Dr. Rosario Primary Care Provider: Vernon Velna SAUNDERS, MD   ASSESSMENT:  Madeline Richmond is a 63 year old Caucasian female admitted with right knee pain following cortisone injection and found to have septic arthritis with cultures positive for Staphylococcus aureus and sensitivities pending.  Status post debridement.  Madeline Richmond is postop day #2 from debridement secondary to septic knee arthritis.  Surgical cultures growing Staphylococcus aureus with sensitivities pending.  Discussed plan of care to continue current dose of vancomycin until sensitivities have returned.  Will need approximately 4 weeks of antimicrobial therapy.  She is planning a trip [place] starting [place] 3.  Discussed standard of care for 4 weeks of IV treatment with risks and benefits.  Following discussion we will treat 3 weeks with IV and 1 week of oral with antibiotic to be determined pending sensitivities.  Place PICC line.  Standard/universal precautions.  Postoperative wound care per orthopedics.  Remaining medical and supportive care per internal medicine.   PLAN:  Continue current dose of vancomycin. Therapeutic drug monitoring of renal function and vancomycin levels per protocol. Monitor cultures for sensitivities and narrow antibiotics as appropriate. Postoperative wound care per orthopedics. Place PICC. Standard/universal precautions. Remaining medical and supportive care per internal medicine.   Principal Problem:   Septic arthritis (HCC) Active Problems:   GERD (gastroesophageal reflux disease)   Anxiety and depression   Leukocytosis   Hyponatremia    enoxaparin (LOVENOX) injection  40 mg Subcutaneous Q24H   FLUoxetine   40 mg Oral q AM   folic acid  1 mg Oral Daily   multivitamin with minerals  1 tablet Oral  Daily   sodium chloride  flush  3 mL Intravenous Q12H   thiamine  100 mg Oral Daily   Or   thiamine  100 mg Intravenous Daily     HPI: Madeline Richmond is a 63 y.o. female with previous medical history of anxiety, depression, and alcohol use presenting to the hospital with right knee pain only following cortisone injection.  Madeline Richmond received a cortisone injection on 01/23/2024 prior to traveling to [place] .  Within 2 days began experiencing severe knee pain and difficulty walking.  Seen by orthopedics outpatient with aspiration culture showing 68,000 white blood cells and 90% neutrophils.  Afebrile with no leukocytosis white blood cell count of 8600.  Right knee x-ray with no significant abnormalities.  Brought to the OR on 01/28/2024 for right knee arthroscopy.  Synovial fluid was sent for culture and debridement was completed.  Fluid culture showed 26,000 white blood cells with 97% neutrophils and Gram stain showing no organisms and cultures positive for Staphylococcus aureus with sensitivities pending.  Initially received cefazolin  has been transition to vancomycin.  Currently on day 3 of antimicrobial therapy.  ID has been asked for antibiotic recommendations.  Review of Systems: Review of Systems  Constitutional:  Negative for chills, fever and weight loss.  Respiratory:  Negative for cough, shortness of breath and wheezing.   Cardiovascular:  Negative for chest pain and leg swelling.  Gastrointestinal:  Negative for abdominal pain, constipation, diarrhea, nausea and vomiting.  Skin:  Negative for rash.     Past Medical History:  Diagnosis Date   Anxiety    Arthritis  knee   Depression    Eczema    GERD (gastroesophageal reflux disease)    H/O seasonal allergies     Social History   Tobacco Use   Smoking status: Never   Smokeless tobacco: Never  Vaping Use   Vaping status: Never Used  Substance Use Topics   Alcohol use: Yes    Alcohol/week: 7.0 standard drinks  of alcohol    Types: 7 Glasses of wine per week   Drug use: No    Family History  Problem Relation Age of Onset   Arthritis Mother    Prostate cancer Father 43       metastatic   Drug abuse Father    Hypertension Father    Breast cancer Sister 49   Melanoma Brother     Allergies  Allergen Reactions   Codeine Camsylate [Codeine] Nausea And Vomiting    OBJECTIVE: Blood pressure 126/67, pulse 89, temperature 98.1 F (36.7 C), temperature source Oral, resp. rate 20, height 5' 7 (1.702 m), weight 68 kg, SpO2 100%.  Physical Exam Constitutional:      General: She is not in acute distress.    Appearance: She is well-developed.  Cardiovascular:     Rate and Rhythm: Normal rate and regular rhythm.     Heart sounds: Normal heart sounds.  Pulmonary:     Effort: Pulmonary effort is normal.     Breath sounds: Normal breath sounds.  Musculoskeletal:     Comments: Surgical dressing in place  Skin:    General: Skin is warm and dry.  Neurological:     Mental Status: She is alert and oriented to person, place, and time.     Lab Results Lab Results  Component Value Date   WBC 8.5 01/30/2024   HGB 10.7 (L) 01/30/2024   HCT 32.1 (L) 01/30/2024   MCV 89.9 01/30/2024   PLT 278 01/30/2024    Lab Results  Component Value Date   CREATININE 0.70 01/30/2024   BUN 11 01/30/2024   NA 141 01/30/2024   K 4.6 01/30/2024   CL 105 01/30/2024   CO2 27 01/30/2024    Lab Results  Component Value Date   ALT 17 05/05/2023   AST 17 05/05/2023   ALKPHOS 66 05/05/2023   BILITOT 0.6 05/05/2023     Microbiology: Recent Results (from the past 240 hours)  Aerobic/Anaerobic Culture w Gram Stain (surgical/deep wound)     Status: None (Preliminary result)   Collection Time: 01/28/24  6:12 PM   Specimen: Joint, Other; Body Fluid  Result Value Ref Range Status   Specimen Description FLUID RIGHT KNEE  Final   Special Requests NONE  Final   Gram Stain   Final    FEW WBC PRESENT,  PREDOMINANTLY PMN NO ORGANISMS SEEN Performed at [facility], [address], [place], [place] [address]    Culture   Final    RARE STAPHYLOCOCCUS AUREUS SUSCEPTIBILITIES TO FOLLOW NO ANAEROBES ISOLATED; CULTURE IN PROGRESS FOR 5 DAYS    Report Status PENDING  Incomplete     Cathlyn July, NP [facility]  01/30/2024  5:10 PM

## 2024-01-30 NOTE — Progress Notes (Signed)
     Subjective:  Patient reports ncreasing pain in the right knee.  This had been somewhat better yesterday but was worse this morning.  She was able to ambulate a good bit yesterday and not sure if this is contributed  Yesterday's total administered Morphine  Milligram Equivalents: 12   Objective:   VITALS:   Vitals:   01/29/24 1544 01/29/24 2018 01/30/24 0001 01/30/24 0349  BP: (!) 117/55 125/71 99/70 120/61  Pulse: 74 80 70 64  Resp: 20 20 20 20   Temp: 98.6 F (37 C) 99.5 F (37.5 C) 98.9 F (37.2 C) 98.1 F (36.7 C)  TempSrc:  Oral Oral Oral  SpO2: 100% 99% 97% 100%  Weight:      Height:        Clean surgical incisions.  No palpable effusion.  No calf tenderness.  Neurovascular intact Lab Results  Component Value Date   WBC 8.5 01/30/2024   HGB 10.7 (L) 01/30/2024   HCT 32.1 (L) 01/30/2024   MCV 89.9 01/30/2024   PLT 278 01/30/2024   BMET    Component Value Date/Time   NA 141 01/30/2024 0513   K 4.6 01/30/2024 0513   CL 105 01/30/2024 0513   CO2 27 01/30/2024 0513   GLUCOSE 98 01/30/2024 0513   BUN 11 01/30/2024 0513   CREATININE 0.70 01/30/2024 0513   CALCIUM 8.6 (L) 01/30/2024 0513   GFRNONAA >60 01/30/2024 0513    Cultures taken from emerge grew Staph aureus on yesterday's results  Cultures taken the op room are still pending  Assessment/Plan: 2 Days Post-Op   Principal Problem:   Septic arthritis (HCC) Active Problems:   GERD (gastroesophageal reflux disease)   Anxiety and depression   Leukocytosis   Hyponatremia   The cultures taken as an outpatient are concerning for Staph aureus.  Sensitivities are still pending.  At this point this does appear to be a septic arthritis of the knee.  We will switch her over to vancomycin for an antibiotic until sensitivity are back.  She will likely need between 2 to 6 weeks of IV antibiotics.  Likely she require a PICC line.  We may also consider an ID consult.  I discussed the case further with  medicine.   Cordella SHAUNNA Rhein 01/30/2024, 7:07 AM   Cordella Rhein, MD, MS [facility]ist / [place] [phone]   Contact information:   Tzzxijbd 7am-5pm epic message Dr. Rhein, or call office for patient follow up: [phone] After hours and holidays please check [email or web address] for group call information for [facility]

## 2024-01-30 NOTE — Progress Notes (Signed)
 Pharmacy Antibiotic Note  Madeline Richmond is a 63 y.o. female admitted on 01/28/2024 with R knee pain following a steroid injection.  Patient s/p outpatient drainage on 11/10 and repeat OR drainage 11/12.  Per MD, outpt cx growing staph aureus, sensitivities pending.  Pharmacy has been consulted for Vancomycin dosing.  Plan: Vancomycin 1500 mg IV q24h  cAUC 496, using SCr 0.8, Vd 0.72  Height: 5' 7 (170.2 cm) Weight: 68 kg (150 lb) IBW/kg (Calculated) : 61.6  Temp (24hrs), Avg:98.6 F (37 C), Min:98 F (36.7 C), Max:99.5 F (37.5 C)  Recent Labs  Lab 01/28/24 1121 01/28/24 1139 01/29/24 0525 01/30/24 0513  WBC 12.1*  --  8.6 8.5  CREATININE 0.66  --  0.71 0.70  LATICACIDVEN  --  1.2  --   --     Estimated Creatinine Clearance: 69.1 mL/min (by C-G formula based on SCr of 0.7 mg/dL).    Allergies  Allergen Reactions   Codeine Camsylate [Codeine] Nausea And Vomiting    Antimicrobials this admission: Doxy PO (outpatient) 11/10 >> 11/12 Cefazolin  11/12  >>  Vancomycin 11/14 >>  Dose adjustments this admission:   Microbiology results: 11/10 outpt knee fluid cx: staph aureus 11/12 R knee NGTD   Thank you for allowing pharmacy to be a part of this patient's care.  [employer], Pharm.D., BCPS Clinical Pharmacist Clinical phone for 01/30/2024 from 7:30-3:00 is [phone].  **Pharmacist phone directory can be found on [email or web address] listed under [facility].  01/30/2024 1:30 PM

## 2024-01-30 NOTE — Plan of Care (Signed)
 Patient calm and cooperative A&O X4. PIV maintained. Patient educated on glucose level  in relation to steroids and infection. Patient educated on signs of infection and treatment. Medication tolerated well. Patient left with call bell in reach side rails up and bed in lowest position.   Problem: Education: Goal: Knowledge of General Education information will improve Description: Including pain rating scale, medication(s)/side effects and non-pharmacologic comfort measures Outcome: Progressing   Problem: Health Behavior/Discharge Planning: Goal: Ability to manage health-related needs will improve Outcome: Progressing   Problem: Clinical Measurements: Goal: Ability to maintain clinical measurements within normal limits will improve Outcome: Progressing   Problem: Activity: Goal: Risk for activity intolerance will decrease Outcome: Progressing   Problem: Nutrition: Goal: Adequate nutrition will be maintained Outcome: Progressing   Problem: Coping: Goal: Level of anxiety will decrease Outcome: Progressing   Problem: Pain Managment: Goal: General experience of comfort will improve and/or be controlled Outcome: Progressing   Problem: Safety: Goal: Ability to remain free from injury will improve Outcome: Progressing

## 2024-01-30 NOTE — Progress Notes (Signed)
 Peripherally Inserted Central Catheter Placement  The IV Nurse has discussed with the patient and/or persons authorized to consent for the patient, the purpose of this procedure and the potential benefits and risks involved with this procedure.  The benefits include less needle sticks, lab draws from the catheter, and the patient may be discharged home with the catheter. Risks include, but not limited to, infection, bleeding, blood clot (thrombus formation), and puncture of an artery; nerve damage and irregular heartbeat and possibility to perform a PICC exchange if needed/ordered by physician.  Alternatives to this procedure were also discussed.  Bard Power PICC patient education guide, fact sheet on infection prevention and patient information card has been provided to patient /or left at bedside.    PICC Placement Documentation  PICC Single Lumen 01/30/24 Right Basilic 38 cm 0 cm (Active)  Indication for Insertion or Continuance of Line Prolonged intravenous therapies;Home intravenous therapies (PICC only) 01/30/24 2150  Exposed Catheter (cm) 0 cm 01/30/24 2150  Site Assessment Clean, Dry, Intact 01/30/24 2150  Line Status Flushed;Saline locked;Blood return noted 01/30/24 2150  Dressing Type Transparent;Securing device 01/30/24 2150  Dressing Status Antimicrobial disc/dressing in place;Clean, Dry, Intact 01/30/24 2150  Line Care Connections checked and tightened 01/30/24 2150  Line Adjustment (NICU/IV Team Only) No 01/30/24 2150  Dressing Intervention New dressing;Adhesive placed at insertion site (IV team only) 01/30/24 2150  Dressing Change Due 02/06/24 01/30/24 2150       Madeline Richmond, Cherene Place 01/30/2024, 10:09 PM

## 2024-01-30 NOTE — Progress Notes (Signed)
 Progress Note   Patient: Madeline Richmond FMW:[id number] DOB: 11-09-1960 DOA: 01/28/2024  DOS: the patient was seen and examined on 01/30/2024   Brief hospital course: Patient is a 63 year old female past medical history significant for anxiety, depression, and alcohol use.  Patient has been having intra-articular steroid injection for several years (patient ingestant's).  After recent injection, patient developed significant right knee pain.  Patient was seen by the orthopedic surgery team on an outpatient basis and underwent arthrocentesis.  The joint fluid from the right knee is growing Staphylococcus aureus.  Final cultures are pending.  Patient is on IV vancomycin.  Infectious disease team has been consulted to assist with patient's management.    Assessment and Plan:  Likely septic right knee arthritis: - See above documentation. - Right knee fluid is growing Staphylococcus aureus. - Patient is on IV vancomycin. - Follow final cultures [place]-infectious disease team has been consulted.    Mild hyponatremia: - Resolved. - Sodium of 141 today.  Anxiety/depression - Prozac  on board.  GERD - Prilosec on board.    Subjective: No new complaints.  Physical Exam:  Vitals:   01/29/24 2018 01/30/24 0001 01/30/24 0349 01/30/24 0810  BP: 125/71 99/70 120/61 122/60  Pulse: 80 70 64 75  Resp: 20 20 20    Temp: 99.5 F (37.5 C) 98.9 F (37.2 C) 98.1 F (36.7 C) 98.1 F (36.7 C)  TempSrc: Oral Oral Oral Oral  SpO2: 99% 97% 100% 100%  Weight:      Height:        GENERAL: Awake and alert.  Friendly.  Not in any distress. HEENT: No pallor.  No jaundice. Neck: Supple. CVS: S1-S2. Abdomen: Soft and nontender. Neuro: Awake and alert. Extremities: Right knee is wrapped.  No leg edema.    Data Reviewed:  Imaging Studies: DG Knee Complete 4 Views Right Result Date: 01/28/2024 EXAM: 4 OR MORE VIEW(S) Xray of the right knee 01/28/2024 09:36:00 AM COMPARISON: None available.  CLINICAL HISTORY: Pain FINDINGS: BONES AND JOINTS: No acute fracture. No focal osseous lesion. No joint dislocation. No significant joint effusion. No significant degenerative changes. SOFT TISSUES: The soft tissues are unremarkable. IMPRESSION: 1. No significant abnormality. Electronically signed by: Lynwood Seip MD 01/28/2024 09:53 AM EST RP Workstation: [id number]   VAS US  LOWER EXTREMITY VENOUS (DVT) Result Date: 01/27/2024  Lower Venous DVT Study Patient Name:  Madeline Richmond  Date of Exam:   01/27/2024 Medical Rec #: [id number]            Accession #:    [id number] Date of Birth: 1960-10-05             Patient Gender: F Patient Age:   67 years Exam Location:  [address] Procedure:      VAS US  LOWER EXTREMITY VENOUS (DVT) Referring Phys: EVALENE CITRON --------------------------------------------------------------------------------  Indications: Pain, and Edema. Other Indications: Recent cortizon shot and fluid removal. Performing Technologist: Devere Dark RVT  Examination Guidelines: A complete evaluation includes B-mode imaging, spectral Doppler, color Doppler, and power Doppler as needed of all accessible portions of each vessel. Bilateral testing is considered an integral part of a complete examination. Limited examinations for reoccurring indications may be performed as noted. The reflux portion of the exam is performed with the patient in reverse Trendelenburg.  +---------+---------------+---------+-----------+----------+--------------+ RIGHT    CompressibilityPhasicitySpontaneityPropertiesThrombus Aging +---------+---------------+---------+-----------+----------+--------------+ CFV      Full           Yes      Yes                                 +---------+---------------+---------+-----------+----------+--------------+  SFJ      Full           Yes      Yes                                 +---------+---------------+---------+-----------+----------+--------------+ FV Prox   Full           Yes      Yes                                 +---------+---------------+---------+-----------+----------+--------------+ FV Mid   Full           Yes      Yes                                 +---------+---------------+---------+-----------+----------+--------------+ FV DistalFull           Yes      Yes                                 +---------+---------------+---------+-----------+----------+--------------+ PFV      Full           Yes      Yes                                 +---------+---------------+---------+-----------+----------+--------------+ POP      Full           Yes      Yes                                 +---------+---------------+---------+-----------+----------+--------------+ PTV      Full           Yes      Yes                                 +---------+---------------+---------+-----------+----------+--------------+ PERO     Full           Yes      Yes                                 +---------+---------------+---------+-----------+----------+--------------+ Gastroc  Full           Yes      Yes                                 +---------+---------------+---------+-----------+----------+--------------+ GSV      Full           Yes      Yes                                 +---------+---------------+---------+-----------+----------+--------------+ SSV      Full           Yes      Yes                                 +---------+---------------+---------+-----------+----------+--------------+   +----+---------------+---------+-----------+----------+--------------+  LEFTCompressibilityPhasicitySpontaneityPropertiesThrombus Aging +----+---------------+---------+-----------+----------+--------------+ CFV Full           Yes      Yes                                 +----+---------------+---------+-----------+----------+--------------+   Findings reported to [facility] at 11:30.  Summary: RIGHT: - There is no evidence of  deep vein thrombosis in the lower extremity. - There is no evidence of superficial venous thrombosis. - There is no evidence of deep vein thrombosis proximal to the inguinal ligament or in the common femoral vein. NOTE: 3.36 x 1.82 flulid collection lateral right knee   *See table(s) above for measurements and observations. Electronically signed by Lonni Gaskins MD on 01/27/2024 at 11:38:58 AM.    Final     Results are pending, will review when available.  Previous records (including but not limited to H&P, progress notes, nursing notes, TOC management) were reviewed in assessment of this patient.  Labs: CBC: Recent Labs  Lab 01/28/24 1121 01/29/24 0525 01/30/24 0513  WBC 12.1* 8.6 8.5  NEUTROABS 9.6*  --   --   HGB 12.3 11.8* 10.7*  HCT 37.6 34.7* 32.1*  MCV 91.7 89.4 89.9  PLT 240 266 278   Basic Metabolic Panel: Recent Labs  Lab 01/28/24 1121 01/29/24 0525 01/30/24 0513  NA 132* 132* 141  K 3.8 4.2 4.6  CL 98 98 105  CO2 20* 25 27  GLUCOSE 105* 154* 98  BUN 14 14 11   CREATININE 0.66 0.71 0.70  CALCIUM 8.5* 8.5* 8.6*   Liver Function Tests: No results for input(s): AST, ALT, ALKPHOS, BILITOT, PROT, ALBUMIN in the last 168 hours. CBG: Recent Labs  Lab 01/29/24 0735 01/29/24 1542  GLUCAP 140* 127*    Scheduled Meds:  enoxaparin (LOVENOX) injection  40 mg Subcutaneous Q24H   FLUoxetine   40 mg Oral q AM   folic acid  1 mg Oral Daily   multivitamin with minerals  1 tablet Oral Daily   sodium chloride  flush  3 mL Intravenous Q12H   thiamine  100 mg Oral Daily   Or   thiamine  100 mg Intravenous Daily   Continuous Infusions:  calcium gluconate      ceFAZolin  (ANCEF ) IV 1 g (01/30/24 0616)   vancomycin 1,500 mg (01/30/24 0826)   PRN Meds:.albuterol, cyclobenzaprine, LORazepam **OR** LORazepam, oxyCODONE -acetaminophen   Family Communication: Not at bedside  Disposition: Status is: Inpatient Remains inpatient appropriate because: Possible  septic arthritis     Time spent: 35 minutes  Length of inpatient stay: 1 days  Author: Leatrice LILLETTE Chapel, MD 01/30/2024 2:54 PM  For on call review [email or web address].

## 2024-01-31 DIAGNOSIS — M00061 Staphylococcal arthritis, right knee: Secondary | ICD-10-CM | POA: Diagnosis not present

## 2024-01-31 MED ORDER — CEFAZOLIN SODIUM-DEXTROSE 2-4 GM/100ML-% IV SOLN
2.0000 g | Freq: Three times a day (TID) | INTRAVENOUS | Status: DC
  Administered 2024-02-01 – 2024-02-02 (×5): 2 g via INTRAVENOUS
  Filled 2024-01-31 (×5): qty 100

## 2024-01-31 MED ORDER — POLYETHYLENE GLYCOL 3350 17 G PO PACK
17.0000 g | PACK | Freq: Every day | ORAL | Status: DC
  Administered 2024-01-31 – 2024-02-02 (×3): 17 g via ORAL
  Filled 2024-01-31 (×3): qty 1

## 2024-01-31 MED ORDER — SODIUM CHLORIDE 0.9 % IV SOLN: 2.0000 g | INTRAVENOUS | Status: DC

## 2024-01-31 NOTE — Progress Notes (Signed)
 Progress Note   Patient: Madeline Richmond FMW:[id number] DOB: [birth year] DOA: 01/28/2024  DOS: the patient was seen and examined on 01/31/2024   Brief hospital course: Patient is a 63 year old female past medical history significant for anxiety, depression, and alcohol use.  Patient has been having intra-articular steroid injection for several years (patient ingestant's).  After recent injection, patient developed significant right knee pain.  Patient was seen by the orthopedic surgery team on an outpatient basis and underwent arthrocentesis.  The joint fluid from the right knee is growing Staphylococcus aureus.  Final cultures are pending.  Patient is on IV vancomycin.  Infectious disease team has been consulted to assist with patient's management.    01/31/2024: Right knee fluid culture is growing MSSA.  Antibiotics has been changed to IV cefazolin  2 g every 8 hourly.  Input from infectious disease team is highly appreciated.  Will discharge patient once patient is cleared for discharge.  Infectious disease team indicated the patient will likely be discharged on Monday, 02/02/2024.  Patient will need OPAT etc.    Assessment and Plan:  Likely septic right knee arthritis: - See above documentation. - Right knee fluid is growing Staphylococcus aureus. - Comycin has been discontinued.  Patient is on IV vancomycin. - Culture is growing MSSA. - Patient is on on IV cefazolin  2 g every 8 hourly.  Mild hyponatremia: - Resolved. - Last sodium was 141.    Anxiety/depression - Prozac  on board.  GERD - Prilosec on board.  Subjective: No new complaints.  Physical Exam:  Vitals:   01/30/24 2012 01/31/24 0001 01/31/24 0455 01/31/24 0804  BP: 117/60 (!) 99/56 (!) 112/58 (!) 112/59  Pulse: 80 71 74 66  Resp: 20 20 20 16   Temp: 98.7 F (37.1 C) 98.8 F (37.1 C) 98.6 F (37 C) 98.1 F (36.7 C)  TempSrc:      SpO2: 100% 97% 98% 98%  Weight:      Height:        GENERAL: Awake and  alert.  Friendly.  Not in any distress. HEENT: No pallor.  No jaundice. Neck: Supple. CVS: S1-S2. Abdomen: Soft and nontender. Neuro: Awake and alert. Extremities: Right knee is wrapped.  No leg edema.    Data Reviewed:  Imaging Studies: US  EKG SITE RITE Result Date: 01/30/2024 If Site Rite image not attached, placement could not be confirmed due to current cardiac rhythm.  DG Knee Complete 4 Views Right Result Date: 01/28/2024 EXAM: 4 OR MORE VIEW(S) Xray of the right knee 01/28/2024 09:36:00 AM COMPARISON: None available. CLINICAL HISTORY: Pain FINDINGS: BONES AND JOINTS: No acute fracture. No focal osseous lesion. No joint dislocation. No significant joint effusion. No significant degenerative changes. SOFT TISSUES: The soft tissues are unremarkable. IMPRESSION: 1. No significant abnormality. Electronically signed by: Lynwood Seip MD 01/28/2024 09:53 AM EST RP Workstation: [id number]   VAS US  LOWER EXTREMITY VENOUS (DVT) Result Date: 01/27/2024  Lower Venous DVT Study Patient Name:  Rayvn Rickerson  Date of Exam:   01/27/2024 Medical Rec #: [id number]            Accession #:    [id number] Date of Birth: Sep 21, 1960             Patient Gender: F Patient Age:   24 years Exam Location:  [address] Procedure:      VAS US  LOWER EXTREMITY VENOUS (DVT) Referring Phys: EVALENE CITRON --------------------------------------------------------------------------------  Indications: Pain, and Edema. Other Indications: Recent cortizon shot and fluid removal. Performing Technologist:  Devere Dark RVT  Examination Guidelines: A complete evaluation includes B-mode imaging, spectral Doppler, color Doppler, and power Doppler as needed of all accessible portions of each vessel. Bilateral testing is considered an integral part of a complete examination. Limited examinations for reoccurring indications may be performed as noted. The reflux portion of the exam is performed with the patient in reverse  Trendelenburg.  +---------+---------------+---------+-----------+----------+--------------+ RIGHT    CompressibilityPhasicitySpontaneityPropertiesThrombus Aging +---------+---------------+---------+-----------+----------+--------------+ CFV      Full           Yes      Yes                                 +---------+---------------+---------+-----------+----------+--------------+ SFJ      Full           Yes      Yes                                 +---------+---------------+---------+-----------+----------+--------------+ FV Prox  Full           Yes      Yes                                 +---------+---------------+---------+-----------+----------+--------------+ FV Mid   Full           Yes      Yes                                 +---------+---------------+---------+-----------+----------+--------------+ FV DistalFull           Yes      Yes                                 +---------+---------------+---------+-----------+----------+--------------+ PFV      Full           Yes      Yes                                 +---------+---------------+---------+-----------+----------+--------------+ POP      Full           Yes      Yes                                 +---------+---------------+---------+-----------+----------+--------------+ PTV      Full           Yes      Yes                                 +---------+---------------+---------+-----------+----------+--------------+ PERO     Full           Yes      Yes                                 +---------+---------------+---------+-----------+----------+--------------+ Gastroc  Full           Yes      Yes                                 +---------+---------------+---------+-----------+----------+--------------+  GSV      Full           Yes      Yes                                 +---------+---------------+---------+-----------+----------+--------------+ SSV      Full           Yes       Yes                                 +---------+---------------+---------+-----------+----------+--------------+   +----+---------------+---------+-----------+----------+--------------+ LEFTCompressibilityPhasicitySpontaneityPropertiesThrombus Aging +----+---------------+---------+-----------+----------+--------------+ CFV Full           Yes      Yes                                 +----+---------------+---------+-----------+----------+--------------+   Findings reported to [facility] at 11:30.  Summary: RIGHT: - There is no evidence of deep vein thrombosis in the lower extremity. - There is no evidence of superficial venous thrombosis. - There is no evidence of deep vein thrombosis proximal to the inguinal ligament or in the common femoral vein. NOTE: 3.36 x 1.82 flulid collection lateral right knee   *See table(s) above for measurements and observations. Electronically signed by Lonni Gaskins MD on 01/27/2024 at 11:38:58 AM.    Final     Results are pending, will review when available.  Previous records (including but not limited to H&P, progress notes, nursing notes, TOC management) were reviewed in assessment of this patient.  Labs: CBC: Recent Labs  Lab 01/28/24 1121 01/29/24 0525 01/30/24 0513  WBC 12.1* 8.6 8.5  NEUTROABS 9.6*  --   --   HGB 12.3 11.8* 10.7*  HCT 37.6 34.7* 32.1*  MCV 91.7 89.4 89.9  PLT 240 266 278   Basic Metabolic Panel: Recent Labs  Lab 01/28/24 1121 01/29/24 0525 01/30/24 0513  NA 132* 132* 141  K 3.8 4.2 4.6  CL 98 98 105  CO2 20* 25 27  GLUCOSE 105* 154* 98  BUN 14 14 11   CREATININE 0.66 0.71 0.70  CALCIUM 8.5* 8.5* 8.6*   Liver Function Tests: No results for input(s): AST, ALT, ALKPHOS, BILITOT, PROT, ALBUMIN in the last 168 hours. CBG: Recent Labs  Lab 01/29/24 0735 01/29/24 1542  GLUCAP 140* 127*    Scheduled Meds:  Chlorhexidine  Gluconate Cloth  6 each Topical Daily   enoxaparin (LOVENOX) injection  40 mg  Subcutaneous Q24H   FLUoxetine   40 mg Oral q AM   folic acid  1 mg Oral Daily   multivitamin with minerals  1 tablet Oral Daily   polyethylene glycol  17 g Oral Daily   sodium chloride  flush  3 mL Intravenous Q12H   thiamine  100 mg Oral Daily   Or   thiamine  100 mg Intravenous Daily   Continuous Infusions:  calcium gluconate     [START ON 02/01/2024]  ceFAZolin  (ANCEF ) IV     PRN Meds:.albuterol, cyclobenzaprine, LORazepam **OR** LORazepam, oxyCODONE -acetaminophen , sodium chloride  flush  Family Communication: Not at bedside  Disposition: Status is: Inpatient Remains inpatient appropriate because: Possible septic arthritis     Time spent: 35 minutes  Length of inpatient stay: 2 days  Author: Leatrice LILLETTE Chapel, MD 01/31/2024 11:15 AM  For on call review [email or web address].

## 2024-01-31 NOTE — Plan of Care (Signed)
 Patient calm and cooperative A&O X4. PIV removed and PICC right upper arm placed by PICC staff. Medication tolerated well. Patient left with call bell in reach side rails up and bed in lowest position.    Problem: Education: Goal: Knowledge of General Education information will improve Description: Including pain rating scale, medication(s)/side effects and non-pharmacologic comfort measures Outcome: Progressing   Problem: Health Behavior/Discharge Planning: Goal: Ability to manage health-related needs will improve Outcome: Progressing   Problem: Clinical Measurements: Goal: Ability to maintain clinical measurements within normal limits will improve Outcome: Progressing   Problem: Activity: Goal: Risk for activity intolerance will decrease Outcome: Progressing   Problem: Coping: Goal: Level of anxiety will decrease Outcome: Progressing

## 2024-01-31 NOTE — Progress Notes (Signed)
 Pharmacy Antibiotic Note  Madeline Richmond is a 63 y.o. female admitted on 01/28/2024 with septic arthritis.  Pharmacy has been consulted for cefazolin  dosing.  Patient was on vancomycin for septic arthritis. Culture growing S. aureus for which sensitivities have now resulted as pan-sensitive. Pharmacy has been consulted to discontinue vancomycin and start cefazolin .   Plan: Vancomycin discontinued Cefazolin  2g Q8H; 1st dose due when next vancomycin dose would've been due (11/16 at ~0800)  Height: 5' 7 (170.2 cm) Weight: 68 kg (150 lb) IBW/kg (Calculated) : 61.6  Temp (24hrs), Avg:98.6 F (37 C), Min:98.1 F (36.7 C), Max:98.8 F (37.1 C)  Recent Labs  Lab 01/28/24 1121 01/28/24 1139 01/29/24 0525 01/30/24 0513  WBC 12.1*  --  8.6 8.5  CREATININE 0.66  --  0.71 0.70  LATICACIDVEN  --  1.2  --   --     Estimated Creatinine Clearance: 69.1 mL/min (by C-G formula based on SCr of 0.7 mg/dL).    Allergies  Allergen Reactions   Codeine Camsylate [Codeine] Nausea And Vomiting   Thank you for allowing pharmacy to be a part of this patient's care.  Maurilio Patten, PharmD PGY1 Pharmacy Resident [facility] 01/31/2024 10:23 AM

## 2024-01-31 NOTE — Plan of Care (Signed)

## 2024-01-31 NOTE — Progress Notes (Addendum)
 Physical Therapy Treatment Patient Details Name: Madeline Richmond MRN: [id number] DOB: 28-Jun-1960 Today's Date: 01/31/2024   History of Present Illness Pt is a 63 y/o female admitted with R knee pain and swelling following a corisone injection.  Pt s/p arthroscopy of L knee.  PMHx:  anxiety, depression, cholectystectomy,    PT Comments  Focused session on stretching her R knee into flexion and extension as tolerated, with pt providing stretch with the assistance of her L leg. She achieves approximately neutral knee extension and ~70' knee flexion. Educated pt to perform ROM exercises and walk in halls >/ = 3x/day while here and at d/c. She verbalized understanding. She needs cues to perform heel strike and roll off her toes on her R foot during stance phase (tends to not get heel down and performs forefoot initial contact) and to widen her stance to improve her balance. She demonstrated good carryover. Will continue to follow acutely. Educated pt to ask for PT referral from MD if her ROM and gait do not improve as they are anticipated to as her pain improves once she discharges home. She verbalized understanding.    If plan is discharge home, recommend the following: A little help with walking and/or transfers;A little help with bathing/dressing/bathroom;Assistance with cooking/housework;Assist for transportation (PRN assist)   Can travel by private vehicle        Equipment Recommendations  None recommended by PT    Recommendations for Other Services       Precautions / Restrictions Precautions Precautions: Fall (lower fall risk) Recall of Precautions/Restrictions: Intact Restrictions Weight Bearing Restrictions Per Provider Order: No     Mobility  Bed Mobility Overal bed mobility: Modified Independent             General bed mobility comments: HOB elevated, no assistance needed    Transfers Overall transfer level: Modified independent Equipment used: None                General transfer comment: Extra time to power up to stand with pt placing R foot anteriorly, no LOB, no assistance needed    Ambulation/Gait Ambulation/Gait assistance: Supervision Gait Distance (Feet): 180 Feet Assistive device: None, IV Pole (rail as needed) Gait Pattern/deviations: Step-through pattern, Antalgic, Decreased dorsiflexion - right, Decreased weight shift to right, Narrow base of support Gait velocity: reduced Gait velocity interpretation: <1.8 ft/sec, indicate of risk for recurrent falls   General Gait Details: Pt with slow, antalgic gait pattern with R forefoot initial contact and often not getting heel down during stance phase. Cued pt to widen her stance and perform heel-toe steps on her R. Good carryover noted. No LOB with pt intermittently holding onto wall or furniture or IV pole for pain management. Supervision for safety   Stairs Stairs:  (verbally educated pt on leading up with her L leg and down with her R leg on stairs, she verbalized understanding and no need to practice stairs)           Wheelchair Mobility     Tilt Bed    Modified Rankin (Stroke Patients Only)       Balance Overall balance assessment: Mild deficits observed, not formally tested                                          Communication Communication Communication: No apparent difficulties  Cognition Arousal: Alert Behavior During Therapy: [facility]  for tasks assessed/performed   PT - Cognitive impairments: No apparent impairments                         Following commands: Intact      Cueing Cueing Techniques: Verbal cues  Exercises Other Exercises Other Exercises: Gravity assisted R knee flexion stretch sitting EOB, intermittently pt crossing L leg over her R to assist it into further flexion to her tolerance, x5 reps, achieving ~70' knee flexion Other Exercises: R LAQ with end range stretch provided by her L leg crossing under her R ankle  while sitting EOB, x5 reps    General Comments General comments (skin integrity, edema, etc.): Educated pt to perform ROM exercises and walk in halls >/ = 3x/day while here and at d/c. She verbalized understanding.      Pertinent Vitals/Pain Pain Assessment Pain Assessment: 0-10 Pain Score: 4  Pain Location: R knee Pain Descriptors / Indicators: Sore, Guarding, Discomfort, Grimacing Pain Intervention(s): Limited activity within patient's tolerance, Monitored during session, Repositioned    Home Living                          Prior Function            PT Goals (current goals can now be found in the care plan section) Acute Rehab PT Goals Patient Stated Goal: home soon PT Goal Formulation: With patient Time For Goal Achievement: 02/12/24 Potential to Achieve Goals: Good Progress towards PT goals: Progressing toward goals    Frequency    Min 2X/week      PT Plan      Co-evaluation              AM-PAC PT 6 Clicks Mobility   Outcome Measure  Help needed turning from your back to your side while in a flat bed without using bedrails?: None Help needed moving from lying on your back to sitting on the side of a flat bed without using bedrails?: None Help needed moving to and from a bed to a chair (including a wheelchair)?: None Help needed standing up from a chair using your arms (e.g., wheelchair or bedside chair)?: None Help needed to walk in hospital room?: A Little Help needed climbing 3-5 steps with a railing? : A Little 6 Click Score: 22    End of Session   Activity Tolerance: Patient tolerated treatment well;Patient limited by pain Patient left: in bed;with call bell/phone within reach Nurse Communication: Mobility status PT Visit Diagnosis: Pain;Difficulty in walking, not elsewhere classified (R26.2);Unsteadiness on feet (R26.81);Other abnormalities of gait and mobility (R26.89) Pain - Right/Left: Right Pain - part of body: Knee      Time: 8865-8844 PT Time Calculation (min) (ACUTE ONLY): 21 min  Charges:    $Gait Training: 8-22 mins PT General Charges $$ ACUTE PT VISIT: 1 Visit                     Theo Ferretti, PT, DPT Acute Rehabilitation Services  Office: [phone]    [name] 01/31/2024, 12:03 PM

## 2024-01-31 NOTE — Progress Notes (Addendum)
     Subjective:  Continues to have right knee pain, improved with pain medication. Also has some jolts to the knee, especially when sleeping at night, but this improves with muscle relaxer. Has been able to ambulate in her room. No bowel movement in the last week, really hasn't been very hungry. Miralax added this morning.   Yesterday's total administered Morphine  Milligram Equivalents: 15   Objective:   VITALS:   Vitals:   01/30/24 2012 01/31/24 0001 01/31/24 0455 01/31/24 0804  BP: 117/60 (!) 99/56 (!) 112/58 (!) 112/59  Pulse: 80 71 74 66  Resp: 20 20 20 16   Temp: 98.7 F (37.1 C) 98.8 F (37.1 C) 98.6 F (37 C) 98.1 F (36.7 C)  TempSrc:      SpO2: 100% 97% 98% 98%  Weight:      Height:        Clean surgical incisions.  No palpable effusion.  No calf tenderness.  Neurovascular intact Lab Results  Component Value Date   WBC 8.5 01/30/2024   HGB 10.7 (L) 01/30/2024   HCT 32.1 (L) 01/30/2024   MCV 89.9 01/30/2024   PLT 278 01/30/2024   BMET    Component Value Date/Time   NA 141 01/30/2024 0513   K 4.6 01/30/2024 0513   CL 105 01/30/2024 0513   CO2 27 01/30/2024 0513   GLUCOSE 98 01/30/2024 0513   BUN 11 01/30/2024 0513   CREATININE 0.70 01/30/2024 0513   CALCIUM 8.6 (L) 01/30/2024 0513   GFRNONAA >60 01/30/2024 0513    Cultures taken from [facility] grew Staph aureus, Dr. Reyne just got off the phone with the lab who say this has resulted as pan-sensitive staph aureus.   Intra-op cultures show staph aureus as well, susceptibilities pending  Assessment/Plan: 3 Days Post-Op   Principal Problem:   Septic arthritis (HCC) Active Problems:   GERD (gastroesophageal reflux disease)   Anxiety and depression   Leukocytosis   Hyponatremia   Pan-sensitive Staph Aureas septic arthritis of the knee.   - please see ID note for antibiotic plan, 3 weeks IV and 1 week oral due to patient's plan for trip abroad in December - No plans for repeat wash out at this  time. PICC is in place, Ok to discharge from orthopedic standpoint once antibiotic discharge needs set up    Army MARLA Daring 01/31/2024, 9:37 AM    Contact information:   Weekdays 7am-5pm epic message Dr. Reyne, or call office for patient follow up: [phone] After hours and holidays please check [email or web address] for group call information for [facility]

## 2024-01-31 NOTE — Progress Notes (Signed)
 ID brief note  Per Dr Reyne I called the lab where the outside cultures were sent. It was pan-sensitive Staph Aureus including to oxacillin. They will send the formal report but it may end up in a different doc's inbox at [facility] so I may not have the paper copy of the report until Monday   Will change IV Vancomycin to IV cefazolin , pharmacy consulted.  Will be helpful to have sensitivities of Staph aureus uploaded in chart D/w primary team  Annalee Orem, MD Infectious Disease [facility]ician [facility] [address] [place], [place] [address] Phone: [phone]  Fax: [phone]

## 2024-02-01 DIAGNOSIS — M00061 Staphylococcal arthritis, right knee: Secondary | ICD-10-CM | POA: Diagnosis not present

## 2024-02-01 MED ORDER — BISACODYL 10 MG RE SUPP
10.0000 mg | Freq: Every day | RECTAL | Status: DC | PRN
Start: 2024-02-01 — End: 2024-02-03
  Administered 2024-02-02: 10 mg via RECTAL
  Filled 2024-02-01 (×2): qty 1

## 2024-02-01 MED ORDER — SENNA 8.6 MG PO TABS
1.0000 | ORAL_TABLET | Freq: Every day | ORAL | Status: DC
  Administered 2024-02-01 – 2024-02-02 (×2): 8.6 mg via ORAL
  Filled 2024-02-01 (×2): qty 1

## 2024-02-01 MED ORDER — DOCUSATE SODIUM 100 MG PO CAPS
100.0000 mg | ORAL_CAPSULE | Freq: Two times a day (BID) | ORAL | Status: DC
  Administered 2024-02-01 – 2024-02-02 (×3): 100 mg via ORAL
  Filled 2024-02-01 (×4): qty 1

## 2024-02-01 NOTE — Progress Notes (Signed)
 Progress Note   Patient: Madeline Richmond FMW:[id number] DOB: 11-Jul-1960 DOA: 01/28/2024  DOS: the patient was seen and examined on 02/01/2024   Brief hospital course: Patient is a 63 year old female past medical history significant for anxiety, depression, and alcohol use.  Patient has been having intra-articular steroid injection for several years (patient ingestant's).  After recent injection, patient developed significant right knee pain.  Patient was seen by the orthopedic surgery team on an outpatient basis and underwent arthrocentesis.  The joint fluid from the right knee is growing Staphylococcus aureus.  Final cultures are pending.  Patient is on IV vancomycin.  Infectious disease team has been consulted to assist with patient's management.    01/31/2024: Right knee fluid culture is growing MSSA.  Antibiotics has been changed to IV cefazolin  2 g every 8 hourly.  Input from infectious disease team is highly appreciated.  Will discharge patient once patient is cleared for discharge.  Infectious disease team indicated the patient will likely be discharged on Monday, 02/02/2024.  Patient will need OPAT etc.    02/01/2024: Patient seen alongside patient's husband.  Patient is slowly improving.  Right knee synovial fluid culture is also growing MSSA.  Likely discharge home tomorrow.  OPAT as per ID team.  Assessment and Plan:  Likely septic right knee arthritis: - See above documentation. - Right knee fluid is growing Staphylococcus aureus. - Comycin has been discontinued.  Patient is on IV vancomycin. - Culture has grown MSSA. - Patient is on on IV cefazolin  2 g every 8 hourly.  Mild hyponatremia: - Resolved. - Last sodium was 141.    Anxiety/depression - Prozac  on board.  GERD - Prilosec on board.  Subjective: No new complaints.  Right knee pain is slowly improving.  Physical Exam:  Vitals:   02/01/24 0510 02/01/24 0749 02/01/24 1115 02/01/24 1635  BP: 101/60 (!) 105/56  97/65 (!) 117/53  Pulse: 75 71 77 88  Resp: 17 16 16 17   Temp: 98.6 F (37 C) 98 F (36.7 C) 98.1 F (36.7 C) 98.4 F (36.9 C)  TempSrc: Oral     SpO2: 98% 98% 97% 100%  Weight:      Height:        GENERAL: Awake and alert.  Friendly.  Not in any distress. HEENT: No pallor.  No jaundice. Neck: Supple. CVS: S1-S2. Abdomen: Soft and nontender. Neuro: Awake and alert. Extremities: Right knee is wrapped.  No leg edema.    Data Reviewed:  Imaging Studies: US  EKG SITE RITE Result Date: 01/30/2024 If Site Rite image not attached, placement could not be confirmed due to current cardiac rhythm.  DG Knee Complete 4 Views Right Result Date: 01/28/2024 EXAM: 4 OR MORE VIEW(S) Xray of the right knee 01/28/2024 09:36:00 AM COMPARISON: None available. CLINICAL HISTORY: Pain FINDINGS: BONES AND JOINTS: No acute fracture. No focal osseous lesion. No joint dislocation. No significant joint effusion. No significant degenerative changes. SOFT TISSUES: The soft tissues are unremarkable. IMPRESSION: 1. No significant abnormality. Electronically signed by: Lynwood Seip MD 01/28/2024 09:53 AM EST RP Workstation: [id number]   VAS US  LOWER EXTREMITY VENOUS (DVT) Result Date: 01/27/2024  Lower Venous DVT Study Patient Name:  Madeline Richmond  Date of Exam:   01/27/2024 Medical Rec #: [id number]            Accession #:    [id number] Date of Birth: 12-17-1960             Patient Gender: F Patient Age:  63 years Exam Location:  [address] Procedure:      VAS US  LOWER EXTREMITY VENOUS (DVT) Referring Phys: Madeline Richmond --------------------------------------------------------------------------------  Indications: Pain, and Edema. Other Indications: Recent cortizon shot and fluid removal. Performing Technologist: Devere Dark RVT  Examination Guidelines: A complete evaluation includes B-mode imaging, spectral Doppler, color Doppler, and power Doppler as needed of all accessible portions of each vessel.  Bilateral testing is considered an integral part of a complete examination. Limited examinations for reoccurring indications may be performed as noted. The reflux portion of the exam is performed with the patient in reverse Trendelenburg.  +---------+---------------+---------+-----------+----------+--------------+ RIGHT    CompressibilityPhasicitySpontaneityPropertiesThrombus Aging +---------+---------------+---------+-----------+----------+--------------+ CFV      Full           Yes      Yes                                 +---------+---------------+---------+-----------+----------+--------------+ SFJ      Full           Yes      Yes                                 +---------+---------------+---------+-----------+----------+--------------+ FV Prox  Full           Yes      Yes                                 +---------+---------------+---------+-----------+----------+--------------+ FV Mid   Full           Yes      Yes                                 +---------+---------------+---------+-----------+----------+--------------+ FV DistalFull           Yes      Yes                                 +---------+---------------+---------+-----------+----------+--------------+ PFV      Full           Yes      Yes                                 +---------+---------------+---------+-----------+----------+--------------+ POP      Full           Yes      Yes                                 +---------+---------------+---------+-----------+----------+--------------+ PTV      Full           Yes      Yes                                 +---------+---------------+---------+-----------+----------+--------------+ PERO     Full           Yes      Yes                                 +---------+---------------+---------+-----------+----------+--------------+  Gastroc  Full           Yes      Yes                                  +---------+---------------+---------+-----------+----------+--------------+ GSV      Full           Yes      Yes                                 +---------+---------------+---------+-----------+----------+--------------+ SSV      Full           Yes      Yes                                 +---------+---------------+---------+-----------+----------+--------------+   +----+---------------+---------+-----------+----------+--------------+ LEFTCompressibilityPhasicitySpontaneityPropertiesThrombus Aging +----+---------------+---------+-----------+----------+--------------+ CFV Full           Yes      Yes                                 +----+---------------+---------+-----------+----------+--------------+   Findings reported to [facility] at 11:30.  Summary: RIGHT: - There is no evidence of deep vein thrombosis in the lower extremity. - There is no evidence of superficial venous thrombosis. - There is no evidence of deep vein thrombosis proximal to the inguinal ligament or in the common femoral vein. NOTE: 3.36 x 1.82 flulid collection lateral right knee   *See table(s) above for measurements and observations. Electronically signed by Lonni Gaskins MD on 01/27/2024 at 11:38:58 AM.    Final     Results are pending, will review when available.  Previous records (including but not limited to H&P, progress notes, nursing notes, TOC management) were reviewed in assessment of this patient.  Labs: CBC: Recent Labs  Lab 01/28/24 1121 01/29/24 0525 01/30/24 0513  WBC 12.1* 8.6 8.5  NEUTROABS 9.6*  --   --   HGB 12.3 11.8* 10.7*  HCT 37.6 34.7* 32.1*  MCV 91.7 89.4 89.9  PLT 240 266 278   Basic Metabolic Panel: Recent Labs  Lab 01/28/24 1121 01/29/24 0525 01/30/24 0513  NA 132* 132* 141  K 3.8 4.2 4.6  CL 98 98 105  CO2 20* 25 27  GLUCOSE 105* 154* 98  BUN 14 14 11   CREATININE 0.66 0.71 0.70  CALCIUM 8.5* 8.5* 8.6*   Liver Function Tests: No results for input(s):  AST, ALT, ALKPHOS, BILITOT, PROT, ALBUMIN in the last 168 hours. CBG: Recent Labs  Lab 01/29/24 0735 01/29/24 1542  GLUCAP 140* 127*    Scheduled Meds:  Chlorhexidine  Gluconate Cloth  6 each Topical Daily   docusate sodium  100 mg Oral BID   enoxaparin (LOVENOX) injection  40 mg Subcutaneous Q24H   FLUoxetine   40 mg Oral q AM   folic acid  1 mg Oral Daily   multivitamin with minerals  1 tablet Oral Daily   polyethylene glycol  17 g Oral Daily   senna  1 tablet Oral Daily   sodium chloride  flush  3 mL Intravenous Q12H   thiamine  100 mg Oral Daily   Or   thiamine  100 mg Intravenous Daily   Continuous Infusions:  calcium gluconate  ceFAZolin  (ANCEF ) IV 2 g (02/01/24 1553)   PRN Meds:.albuterol, bisacodyl, cyclobenzaprine, oxyCODONE -acetaminophen , sodium chloride  flush  Family Communication: Not at bedside  Disposition: Status is: Inpatient Remains inpatient appropriate because: Possible septic arthritis     Time spent: 35 minutes  Length of inpatient stay: 3 days  Author: Leatrice LILLETTE Chapel, MD 02/01/2024 5:05 PM  For on call review [email or web address].

## 2024-02-01 NOTE — Plan of Care (Signed)

## 2024-02-01 NOTE — Progress Notes (Signed)
     Subjective:  Patient reports pain as moderate.  She complains of constipation, and is unhappy about the entire situation.  She has had difficult time with ambulation as well.  Opiates Today (MME): Today's  total administered Morphine  Milligram Equivalents: 7.5 Opiates Yesterday (MME): Yesterday's total administered Morphine  Milligram Equivalents: 22.5  Objective:   VITALS:   Vitals:   01/31/24 2000 02/01/24 0133 02/01/24 0510 02/01/24 0749  BP: 132/68 130/60 101/60 (!) 105/56  Pulse: 80 85 75 71  Resp: 16 16 17 16   Temp: 99 F (37.2 C) 99 F (37.2 C) 98.6 F (37 C) 98 F (36.7 C)  TempSrc: Oral Oral Oral   SpO2: 99% 99% 98% 98%  Weight:      Height:        Surgical wounds are healing well, no drainage, no redness, just a very mild effusion.  Dressings are changed.  Lab Results  Component Value Date   WBC 8.5 01/30/2024   HGB 10.7 (L) 01/30/2024   HCT 32.1 (L) 01/30/2024   MCV 89.9 01/30/2024   PLT 278 01/30/2024   BMET    Component Value Date/Time   NA 141 01/30/2024 0513   K 4.6 01/30/2024 0513   CL 105 01/30/2024 0513   CO2 27 01/30/2024 0513   GLUCOSE 98 01/30/2024 0513   BUN 11 01/30/2024 0513   CREATININE 0.70 01/30/2024 0513   CALCIUM 8.6 (L) 01/30/2024 0513   GFRNONAA >60 01/30/2024 0513     Assessment/Plan: 4 Days Post-Op   Principal Problem:   Septic arthritis (HCC) Active Problems:   GERD (gastroesophageal reflux disease)   Anxiety and depression   Leukocytosis   Hyponatremia   Methicillin-sensitive Staph aureus septic arthritis of the right knee, history of cortisone injection.  Will add Colace and Senokot to her bowel regimen, she already has MiraLAX, appreciate internal medicine and infectious disease input, likely discharge tomorrow with home health antibiotics arranged.   Madeline Richmond 02/01/2024, 8:21 AM   Madeline Olmsted, MD Cell [phone]

## 2024-02-02 ENCOUNTER — Other Ambulatory Visit (HOSPITAL_COMMUNITY): Payer: Self-pay

## 2024-02-02 DIAGNOSIS — M00061 Staphylococcal arthritis, right knee: Secondary | ICD-10-CM | POA: Diagnosis not present

## 2024-02-02 DIAGNOSIS — R21 Rash and other nonspecific skin eruption: Secondary | ICD-10-CM | POA: Diagnosis not present

## 2024-02-02 LAB — AEROBIC/ANAEROBIC CULTURE W GRAM STAIN (SURGICAL/DEEP WOUND)

## 2024-02-02 MED ORDER — HEPARIN SOD (PORK) LOCK FLUSH 100 UNIT/ML IV SOLN
250.0000 [IU] | INTRAVENOUS | Status: AC | PRN
  Administered 2024-02-02: 250 [IU]

## 2024-02-02 MED ORDER — ADULT MULTIVITAMIN W/MINERALS CH
1.0000 | ORAL_TABLET | Freq: Every day | ORAL | 0 refills | Status: DC
  Filled 2024-02-02: qty 30, 30d supply, fill #0

## 2024-02-02 MED ORDER — FOLIC ACID 1 MG PO TABS
1.0000 mg | ORAL_TABLET | Freq: Every day | ORAL | 0 refills | Status: DC
  Filled 2024-02-02: qty 30, 30d supply, fill #0

## 2024-02-02 MED ORDER — CEFAZOLIN IV (FOR PTA / DISCHARGE USE ONLY): 2.0000 g | Freq: Three times a day (TID) | INTRAVENOUS | 0 refills | Status: AC

## 2024-02-02 MED ORDER — SENNA 8.6 MG PO TABS
1.0000 | ORAL_TABLET | Freq: Every day | ORAL | 0 refills | Status: DC
  Filled 2024-02-02: qty 120, 120d supply, fill #0

## 2024-02-02 MED ORDER — THIAMINE HCL 100 MG PO TABS
100.0000 mg | ORAL_TABLET | Freq: Every day | ORAL | 0 refills | Status: DC
  Filled 2024-02-02: qty 30, 30d supply, fill #0

## 2024-02-02 MED ORDER — POLYETHYLENE GLYCOL 3350 17 GM/SCOOP PO POWD
17.0000 g | Freq: Every day | ORAL | 0 refills | Status: DC
  Filled 2024-02-02: qty 238, 14d supply, fill #0

## 2024-02-02 NOTE — Discharge Summary (Signed)
 Physician Discharge Summary  Patient ID: Karisa Nesser MRN: [id number] DOB/AGE: 12/09/1960 63 y.o.  Admit date: 01/28/2024 Discharge date: 02/02/2024  Admission Diagnoses:  Discharge Diagnoses:  Principal Problem:   Septic arthritis (HCC) Active Problems:   GERD (gastroesophageal reflux disease)   Anxiety and depression   Leukocytosis   Hyponatremia   Discharged Condition: stable  Hospital Course:  Patient is a 63 year old female past medical history significant for anxiety, depression, and alcohol use.  Patient has had intra-articular steroid injection to the knee for several years.  Following recent steroid injection into the knee, patient developed significant right knee pain.  Patient was seen by the orthopedic surgery team on an outpatient basis and underwent arthrocentesis.  The joint fluid from the right knee has grown MSSA.  Patient will complete 3 weeks of IV cefazolin , then transition to oral antibiotics for about 1 week.  Patient will follow-up with infectious disease team in 4 days.  The infectious disease team will transition patient to oral antibiotics, after completion of IV cefazolin .   Septic right knee arthritis: - Secondary to MSSA.   - Right knee joint synovial fluid grew MSSA.   -Patient will complete IV cefazolin  for 3 weeks, then completes 1 week of oral antibiotics (apparently, patient plans to travel)   Mild hyponatremia: - Resolved.   Anxiety/depression - Prozac  on board.   GERD - Prilosec on board.    Consults: ID and orthopedic surgery  Significant Diagnostic Studies:  -Right knee synovial fluid culture grew MSSA. -X-ray of the right knee did not reveal any acute abnormality.  Treatments: IV antibiotics.  Discharge Exam: Blood pressure 111/60, pulse 68, temperature 98.2 F (36.8 C), temperature source Oral, resp. rate 20, height 5' 7 (1.702 m), weight 68 kg, SpO2 97%.   Disposition: Discharge disposition: 01-Home or Self  Care       Discharge Instructions     [facility] pharmacist to adjust dose for Vancomycin, Aminoglycosides and other anti-infective therapies as requested by physician.   Complete by: As directed    [facility] to provide Cath Flo 2mg    Complete by: As directed    Administer for PICC line occlusion and as ordered by physician for other access device issues.   Anaphylaxis Kit: Provided to treat any anaphylactic reaction to the medication being provided to the patient if First Dose or when requested by physician   Complete by: As directed    Epinephrine  1mg /ml vial / amp: Administer 0.3mg  (0.60ml) subcutaneously once for moderate to severe anaphylaxis, nurse to call physician and pharmacy when reaction occurs and call 911 if needed for immediate care   Diphenhydramine 50mg /ml IV vial: Administer 25-50mg  IV/IM PRN for first dose reaction, rash, itching, mild reaction, nurse to call physician and pharmacy when reaction occurs   Sodium Chloride  0.9% NS 500ml IV: Administer if needed for hypovolemic blood pressure drop or as ordered by physician after call to physician with anaphylactic reaction   Change dressing on IV access line weekly and PRN   Complete by: As directed    Diet - low sodium heart healthy   Complete by: As directed    Discharge wound care:   Complete by: As directed    Continue current wound care plan   Flush IV access with Sodium Chloride  0.9% and Heparin 10 units/ml or 100 units/ml   Complete by: As directed    Home infusion instructions - [facility]   Complete by: As directed  Instructions: Flush IV access with Sodium Chloride  0.9% and Heparin 10units/ml or 100units/ml   Change dressing on IV access line: Weekly and PRN   Instructions Cath Flo 2mg : Administer for PICC Line occlusion and as ordered by physician for other access device   [facility] pharmacist to adjust dose for: Vancomycin, Aminoglycosides and other  anti-infective therapies as requested by physician   Increase activity slowly   Complete by: As directed    Method of administration may be changed at the discretion of home infusion pharmacist based upon assessment of the patient and/or caregiver's ability to self-administer the medication ordered   Complete by: As directed       Allergies as of 02/02/2024       Reactions   Codeine Camsylate [codeine] Nausea And Vomiting        Medication List     STOP taking these medications    doxycycline 100 MG capsule Commonly known as: VIBRAMYCIN   omeprazole  20 MG capsule Commonly known as: PRILOSEC   SEMAGLUTIDE [place]       TAKE these medications    ceFAZolin  IVPB Commonly known as: ANCEF  Inject 2 g into the vein every 8 (eight) hours for 16 days. Indication:  Septic arthritis of knee  First Dose: Yes Last Day of Therapy:  02/18/24 Labs - Once weekly:  CBC/D and BMP, Labs - Once weekly: ESR and CRP Method of administration: IV Push Method of administration may be changed at the discretion of home infusion pharmacist based upon assessment of the patient and/or caregiver's ability to self-administer the medication ordered.   FLUoxetine  40 MG capsule Commonly known as: PROZAC  Take 40 mg by mouth in the morning.   folic acid 1 MG tablet Commonly known as: FOLVITE Take 1 tablet (1 mg total) by mouth daily. Start taking on: February 03, 2024   HYDROcodone -acetaminophen  5-325 MG tablet Commonly known as: NORCO/VICODIN Take 1 tablet by mouth every 6 (six) hours as needed for moderate pain (pain score 4-6) or severe pain (pain score 7-10).   multivitamin with minerals Tabs tablet Take 1 tablet by mouth daily. Start taking on: February 03, 2024   polyethylene glycol 17 g packet Commonly known as: MIRALAX / GLYCOLAX Take 17 g by mouth daily. Start taking on: February 03, 2024   senna 8.6 MG Tabs tablet Commonly known as: SENOKOT Take 1 tablet (8.6 mg total) by mouth  daily. Start taking on: February 03, 2024   thiamine 100 MG tablet Commonly known as: Vitamin B-1 Take 1 tablet (100 mg total) by mouth daily. Start taking on: February 03, 2024               Discharge Care Instructions  (From admission, onward)           Start     Ordered   02/02/24 0000  Change dressing on IV access line weekly and PRN  (Home infusion instructions - [facility] )        02/02/24 1313   02/02/24 0000  Discharge wound care:       Comments: Continue current wound care plan   02/02/24 1633            Follow-up Information     [facility] Follow up.   Why: [facility] will provide IV antibiotics for home and home health RN that will provide teaching, labs and PICC line dressing changes.        Pahwani, Rinka R, MD Follow up in 1 week(s).  Specialty: Internal Medicine Contact information: [address] [place] [address] [phone]         [facility]              Follow up in 4 day(s).   Contact information: [address] [place]  [address]               Time spent: 35 minutes.  SignedBETHA Leatrice LILLETTE Rosario 02/02/2024, 4:33 PM

## 2024-02-02 NOTE — TOC Progression Note (Addendum)
 Transition of Care [facility]) - Progression Note    Patient Details  Name: Madeline Richmond MRN: [id number] Date of Birth: April 07, 1960  [facility] CM/SW Contact  Rosaline JONELLE Joe, RN Phone Number: 02/02/2024, 10:25 AM  Clinical Narrative:    CM called and spoke with Holley Herring, RNCM with [facility] and updated her that patient will likely need IV antibiotics for home - teaching/coordination of IV antibiotics for home prior to patient's discharge.  Patient is pending rounds by ID this morning to determine which antibiotics patient will need for home.    Patient has Right arm PICC line present at this time  [facility] RN order placed.  Patient will need OPAT placed and signed by MD once home medication has been determined.  02/02/24 1556 - I spoke with Holley herring, RNCM and she received the printed OPAT via fax.  Holley herring, RNCM plans to come and teach the patient at the bedside today between 430 pm and 5 pm.  Patient is aware.  Bedside nursing was asked to obtain extension for the PICC line before patient is discharged home.  Patient states that she is constipated.  Bedside nursing will provide her with a rectal suppository today before she goes home this evening.  Patient will receive her 4 pm dose to antibiotics and will discharge home with her PICC line.  MD is aware and will place the discharge instructions.  Patient plans to call a friend to provide transportation to home by car.                     Expected Discharge Plan and Services                                               Social Drivers of Health (SDOH) Interventions SDOH Screenings   Food Insecurity: No Food Insecurity (01/28/2024)  Housing: Low Risk  (01/28/2024)  Transportation Needs: No Transportation Needs (01/28/2024)  Utilities: Not At Risk (01/28/2024)  Tobacco Use: Low Risk  (01/28/2024)    Readmission Risk Interventions    02/02/2024   10:25 AM 01/29/2024    3:25  PM  Readmission Risk Prevention Plan  Post Dischage Appt Complete Complete  Medication Screening Complete Complete  Transportation Screening Complete Complete

## 2024-02-02 NOTE — Progress Notes (Signed)
 Mobility Specialist: Progress Note   02/02/24 0800  Mobility  Activity Ambulated with assistance  Level of Assistance Standby assist, set-up cues, supervision of patient - no hands on  Assistive Device None  Distance Ambulated (ft) 300 ft  Activity Response Tolerated well  Mobility Referral Yes  Mobility visit 1 Mobility  Mobility Specialist Start Time (ACUTE ONLY) 0850  Mobility Specialist Stop Time (ACUTE ONLY) 0904  Mobility Specialist Time Calculation (min) (ACUTE ONLY) 14 min    Pt received in bed, agreeable to mobility session. SV throughout. C/o R knee pain and swelling. Returned to room and complete a few ROM exercise. Left in bed with all needs met, call bell in reach.   [name] Mobility Specialist Please contact via SecureChat or Rehab office at [phone]

## 2024-02-02 NOTE — Progress Notes (Signed)
 Patient broke out into rash this am on her back and Left hip area. Provider notified

## 2024-02-02 NOTE — Progress Notes (Signed)
     Subjective:  Patient reports pain as tolerable.  She has been able to ambulate  Yesterday's total administered Morphine  Milligram Equivalents: 15   Objective:   VITALS:   Vitals:   02/01/24 2006 02/02/24 0025 02/02/24 0419 02/02/24 0758  BP: 112/63 (!) 109/55 (!) 105/58 111/60  Pulse: 85 72 78 68  Resp: 20 20 20    Temp: 99.3 F (37.4 C) 98.3 F (36.8 C) 98.1 F (36.7 C) 98.2 F (36.8 C)  TempSrc: Oral Oral Oral Oral  SpO2: 100% 99% 96% 97%  Weight:      Height:        Well-appearing woman in no acute distress.  Healed surgical incisions.  Lab Results  Component Value Date   WBC 8.5 01/30/2024   HGB 10.7 (L) 01/30/2024   HCT 32.1 (L) 01/30/2024   MCV 89.9 01/30/2024   PLT 278 01/30/2024   BMET    Component Value Date/Time   NA 141 01/30/2024 0513   K 4.6 01/30/2024 0513   CL 105 01/30/2024 0513   CO2 27 01/30/2024 0513   GLUCOSE 98 01/30/2024 0513   BUN 11 01/30/2024 0513   CREATININE 0.70 01/30/2024 0513   CALCIUM 8.6 (L) 01/30/2024 0513   GFRNONAA >60 01/30/2024 0513      Assessment/Plan: 5 Days Post-Op   Principal Problem:   Septic arthritis (HCC) Active Problems:   GERD (gastroesophageal reflux disease)   Anxiety and depression   Leukocytosis   Hyponatremia   The patient is doing well after surgery.  Pain is improving.  The cultures did show pansensitive Staph aureus.  She continue with antibiotics per ID.  Of note she did notice a rash on her back.  I would defer to ID for any adjustments to her medications or if they think this is related to the antibiotics.  In the meantime would recommend Benadryl to help with any itching or cortisone cream.  From an orthopedic standpoint, she can be discharged to home to follow-up with me in a week and a half.    Madeline Richmond 02/02/2024, 10:31 AM   Madeline Rhein, MD, MS [facility] Orthopedics Specialist / [place] [phone]   Contact information:   Tzzxijbd 7am-5pm epic  message Dr. Rhein, or call office for patient follow up: [phone] After hours and holidays please check [email or web address] for group call information for [facility]

## 2024-02-02 NOTE — Plan of Care (Signed)
  Problem: Education: Goal: Knowledge of General Education information will improve Description: Including pain rating scale, medication(s)/side effects and non-pharmacologic comfort measures Outcome: Progressing   Problem: Health Behavior/Discharge Planning: Goal: Ability to manage health-related needs will improve Outcome: Progressing   Problem: Clinical Measurements: Goal: Respiratory complications will improve Outcome: Progressing   Problem: Clinical Measurements: Goal: Cardiovascular complication will be avoided Outcome: Progressing   

## 2024-02-02 NOTE — Progress Notes (Signed)
 A/p Diagnosis: Right knee mssa septic arthritis S/p I&D 11/12  Back rash not itchy after several days of cefazolin . No sign of other type 3 or type 4 reaction. Doesn't suggest type 1 hypersensitivity   Discuss with patient about rash, her husband can take picture daily and if she feels unwell with rash spreading, itch, could consider switching abx at that time  Early visit arranged and further discussion about rash can be had in id clinic if needed  Abx plan as below Maintain standard isolation in house Id will sign off Discuss with primary team   OPAT Orders Discharge antibiotics to be given via PICC line Discharge antibiotics: Cefazolin    Duration: 4 weeks End Date: 02/18/24  [facility] Care Per Protocol:  Home health RN for IV administration and teaching; PICC line care and labs.    Labs weekly while on IV antibiotics: _x_ CBC with differential __ BMP _x_ CMP _x_ CRP _x_ ESR __ Vancomycin trough __ CK  _x_ Please pull PIC at completion of IV antibiotics __ Please leave PIC in place until doctor has seen patient or been notified  Fax weekly labs to [phone]  Clinic Follow Up Appt: 11/21 with dr Dea  @  [facility] [address], [place], [place] [address] Phone: [phone]   -------- Subjective Rash patient feels bump on back Picture reviewed and examined today seems rash better No hx pcn allergy or other abx allergy  Objectives: Vitals:   02/02/24 0419 02/02/24 0758  BP: (!) 105/58 111/60  Pulse: 78 68  Resp: 20   Temp: 98.1 F (36.7 C) 98.2 F (36.8 C)  SpO2: 96% 97%   General/constitutional: no distress, pleasant HEENT: Normocephalic, PER, Conj Clear, EOMI, Oropharynx clear Neck supple CV: rrr no mrg Lungs: clear to auscultation, normal respiratory effort Abd: Soft, Nontender Ext: no edema Skin: No Rash Neuro: nonfocal  Imaging: None new

## 2024-02-02 NOTE — Progress Notes (Signed)
 PHARMACY CONSULT NOTE FOR:  OUTPATIENT  PARENTERAL ANTIBIOTIC THERAPY (OPAT)  Indication: Septic arthritis of knee Regimen: Cefazolin  2 gm IV Q 8 hours  End date: 02/18/24  IV antibiotic discharge orders are pended. To discharging provider:  please sign these orders via discharge navigator,  Select New Orders & click on the button choice - Manage This Unsigned Work.     Thank you for allowing pharmacy to be a part of this patient's care.  Damien Quiet, PharmD, BCPS, BCIDP Infectious Diseases Clinical Pharmacist Phone: [phone] 02/02/2024, 1:10 PM

## 2024-02-06 ENCOUNTER — Telehealth: Payer: Self-pay

## 2024-02-06 ENCOUNTER — Encounter: Payer: Self-pay | Admitting: Infectious Diseases

## 2024-02-06 ENCOUNTER — Ambulatory Visit (INDEPENDENT_AMBULATORY_CARE_PROVIDER_SITE_OTHER): Admitting: Infectious Diseases

## 2024-02-06 ENCOUNTER — Other Ambulatory Visit: Payer: Self-pay

## 2024-02-06 VITALS — BP 112/73 | HR 105 | Temp 98.7°F | Wt 151.0 lb

## 2024-02-06 DIAGNOSIS — Z5181 Encounter for therapeutic drug level monitoring: Secondary | ICD-10-CM | POA: Diagnosis not present

## 2024-02-06 DIAGNOSIS — A4901 Methicillin susceptible Staphylococcus aureus infection, unspecified site: Secondary | ICD-10-CM | POA: Diagnosis not present

## 2024-02-06 DIAGNOSIS — Z452 Encounter for adjustment and management of vascular access device: Secondary | ICD-10-CM | POA: Diagnosis not present

## 2024-02-06 DIAGNOSIS — M00061 Staphylococcal arthritis, right knee: Secondary | ICD-10-CM

## 2024-02-06 NOTE — Telephone Encounter (Signed)
 Per Dr. Dea reached out to [employer] with orders to extend antibiotic through 12/8. Pt also asking if she could have additional caps for picc sent in. Will ask [employer] team regarding this.   OPAT Orders Discharge antibiotics to be given via PICC line Discharge antibiotics: cefazolin  2g iv q8 hrs  Per pharmacy protocol  End Date: 02/23/24   [facility] Care Per Protocol:   Home health RN for IV administration and teaching; PICC line care and labs.     Labs weekly while on IV antibiotics: X__ CBC with differential X__ BMP __ CMP X__ CRP X__ ESR __ Vancomycin  trough __ CK   __ Please pull PIC at completion of IV antibiotics X__ Please leave PIC in place until doctor has seen patient or been notified   Fax weekly labs to [phone]   Clinic Follow Up Appt: 12/8 at 1 pm with Dr dennise   Annalee Dea, MD Infectious Disease Physician [facility] [address] [place], [place] [address] Phone: [phone]  Fax: [phone] Lorenda CHRISTELLA Code, RMA

## 2024-02-06 NOTE — Telephone Encounter (Signed)
 Thank you :)

## 2024-02-06 NOTE — Progress Notes (Signed)
 OPAT  Diagnosis: Rt knee septic arthritis  Culture Result: MSSA  Allergies  Allergen Reactions   Codeine Camsylate [Codeine] Nausea And Vomiting    OPAT Orders Discharge antibiotics to be given via PICC line Discharge antibiotics: cefazolin  2g iv q8 hrs  Per pharmacy protocol  End Date: 02/23/24  [facility] Care Per Protocol:  Home health RN for IV administration and teaching; PICC line care and labs.    Labs weekly while on IV antibiotics: X__ CBC with differential X__ BMP __ CMP X__ CRP X__ ESR __ Vancomycin  trough __ CK  __ Please pull PIC at completion of IV antibiotics X__ Please leave PIC in place until doctor has seen patient or been notified  Fax weekly labs to [phone]  Clinic Follow Up Appt: 12/8 at 1 pm with Dr dennise  Annalee Orem, MD Infectious Disease Physician [facility] [address] [place], [place] [address] Phone: [phone]  Fax: [phone]'

## 2024-02-06 NOTE — Progress Notes (Signed)
 Reason for Fu: septic arthritis   Patient Active Problem List   Diagnosis Date Noted   Septic arthritis (HCC) 01/28/2024   Leukocytosis 01/28/2024   Hyponatremia 01/28/2024   Genetic testing 03/19/2022   At high risk for breast cancer 03/19/2022   Family history of breast cancer 02/28/2022   Family history of prostate cancer in father 02/28/2022   Allergic rhinitis 09/24/2021   Decreased estrogen level 09/24/2021   Diarrhea 09/24/2021   Hemorrhoids without complication 09/24/2021   Irritable bowel syndrome 09/24/2021   Knee pain 09/24/2021   Perimenopausal 09/24/2021   History of colonic polyps 09/24/2021   Pure hypercholesterolemia 09/24/2021   Skin sensation disturbance 09/24/2021   Osteoarthritis of left knee 08/29/2017   Anxiety and depression 04/22/2017   Sleep disorder, circadian, 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 Indication:  Septic arthritis of knee  First Dose: Yes Last Day of Therapy:  02/18/24 Labs - Once weekly:  CBC/D and BMP, Labs - Once weekly: ESR and CRP Method of administration: IV Push Method of administration may be changed at the discretion of home infusion pharmacist based upon assessment of the patient and/or caregiver's ability to self-administer the medication ordered.   FLUOXETINE  (PROZAC ) 40 MG CAPSULE    Take 40 mg by mouth in the morning.   FOLIC ACID  (FOLVITE ) 1 MG TABLET    Take 1 tablet (1 mg total) by mouth daily.   HYDROCODONE -ACETAMINOPHEN  (NORCO/VICODIN) 5-325 MG TABLET    Take 1 tablet by mouth every 6 (six) hours as needed for moderate pain (pain score 4-6) or severe  pain (pain score 7-10).   MULTIPLE VITAMIN (MULTIVITAMIN WITH MINERALS) TABS TABLET    Take 1 tablet by mouth daily.   POLYETHYLENE GLYCOL POWDER (GLYCOLAX /MIRALAX ) 17 GM/SCOOP POWDER    Take 17 g by mouth daily. Dissolve 1 capful (17g) in 4-8 ounces of liquid and take by mouth daily.   SENNA (SENOKOT) 8.6 MG TABS TABLET    Take 1 tablet (8.6 mg total) by mouth daily.   THIAMINE  (VITAMIN B1) 100 MG TABLET    Take 1 tablet (100 mg total) by mouth daily.  Modified Medications   No medications on file  Discontinued Medications   No medications on file    Subjective: Discussed the use of AI scribe software for clinical note transcription with the patient, who gave verbal consent to proceed.   [name] with h/o anxiety/depression, alcohol use withh/o steroid injection who is here for recent hospital admission 11/12-11/17 for rt knee MSSA septic arthritis. Patient had OP arthrocentesis done which grew MSSA. Underwent rt knee arthroscopy. OR cx MSSA. Discharged to complete 3 weeks of IV cefazolin  through 12/3 to be followed by 1 week of PO antibiotics.   11/21 Accompanied by family member. Receiving IV antibiotics through PICC line with no concerns related to PICC or antibiotics except some loose stool. Saw Orthopedics yesterday for worsening rt knee pain and swelling, they had fluid aspirated and sent for further testing and have a fu on Tuesday. She was also started on percocet for pain. No fevers but  night sweats last few days. She is walking with walker.   Review of Systems: all systems reviewed with pertinent positives and negatives as listed above   Past Medical History:  Diagnosis Date   Anxiety    Arthritis    knee   Depression    Eczema    GERD (gastroesophageal reflux disease)    H/O seasonal allergies    Past Surgical History:  Procedure Laterality Date   BREAST EXCISIONAL BIOPSY Left 1996   benign   CHOLECYSTECTOMY N/A 05/09/2023   Procedure: LAPAROSCOPIC CHOLECYSTECTOMY;   Surgeon: Vernetta Berg, MD;  Location: [facility] OR;  Service: General;  Laterality: N/A;   COLONOSCOPY     EYE SURGERY     strabismus   KNEE ARTHROSCOPY Right 01/28/2024   Procedure: ARTHROSCOPY, KNEE;  Surgeon: Reyne Cordella SQUIBB, MD;  Location: [facility] OR;  Service: Orthopedics;  Laterality: Right;   REFRACTIVE SURGERY      Social History   Tobacco Use   Smoking status: Never   Smokeless tobacco: Never  Vaping Use   Vaping status: Never Used  Substance Use Topics   Alcohol use: Yes    Alcohol/week: 7.0 standard drinks of alcohol    Types: 7 Glasses of wine per week   Drug use: No    Family History  Problem Relation Age of Onset   Arthritis Mother    Prostate cancer Father 75       metastatic   Drug abuse Father    Hypertension Father    Breast cancer Sister 58   Melanoma Brother     Allergies  Allergen Reactions   Codeine Camsylate [Codeine] Nausea And Vomiting    Health Maintenance  Topic Date Due   Zoster Vaccines- Shingrix (2 of 2) 06/22/2019   Pneumococcal Vaccine: 50+ Years (2 of 2 - PCV) 01/21/2020   Mammogram  12/07/2025   Cervical Cancer Screening (HPV/Pap Cotest)  12/19/2026   Colonoscopy  03/16/2029   DTaP/Tdap/Td (2 - Td or Tdap) 06/22/2031   Influenza Vaccine  Completed   COVID-19 Vaccine  Completed   Hepatitis C Screening  Completed   HIV Screening  Completed   Hepatitis B Vaccines 19-59 Average Risk  Aged Out   HPV VACCINES  Aged Out   Meningococcal B Vaccine  Aged Out    Objective: BP 112/73   Pulse (!) 105   Temp 98.7 F (37.1 C) (Oral)   Wt 151 lb (68.5 kg)   SpO2 96%   BMI 23.65 kg/m    Physical Exam Constitutional:      Appearance: Normal appearance.  HENT:     Head: Normocephalic and atraumatic.      Mouth: Mucous membranes are moist.  Eyes:    Conjunctiva/sclera: Conjunctivae normal.     Pupils: Pupils are equal, round, and b/l symmetrical    Cardiovascular:     Rate and Rhythm: Normal rate     Heart sounds:  s1s2  Pulmonary:     Effort: Pulmonary effort is normal.     Breath sounds: Normal breath sounds.   Abdominal:     General: Non distended     Palpations: soft.   Musculoskeletal:        General: ambulatory with walker, rt knee warm, swollen, reasonable ROM  Skin:    General: Skin is warm and dry.     Comments: Rt arm PICC with no signs of infection   Neurological:     General: grossly non focal     Mental  Status: awake, alert and oriented to person, place, and time.   Psychiatric:        Mood and Affect: Mood normal.   Lab Results Lab Results  Component Value Date   WBC 8.5 01/30/2024   HGB 10.7 (L) 01/30/2024   HCT 32.1 (L) 01/30/2024   MCV 89.9 01/30/2024   PLT 278 01/30/2024    Lab Results  Component Value Date   CREATININE 0.70 01/30/2024   BUN 11 01/30/2024   NA 141 01/30/2024   K 4.6 01/30/2024   CL 105 01/30/2024   CO2 27 01/30/2024    Lab Results  Component Value Date   ALT 17 05/05/2023   AST 17 05/05/2023   ALKPHOS 66 05/05/2023   BILITOT 0.6 05/05/2023    No results found for: CHOL, HDL, LDLCALC, LDLDIRECT, TRIG, CHOLHDL No results found for: LABRPR, RPRTITER No results found for: HIV1RNAQUANT, HIV1RNAVL, CD4TABS   Microbiology Results for orders placed or performed during the hospital encounter of 01/28/24  Aerobic/Anaerobic Culture w Gram Stain (surgical/deep wound)     Status: None   Collection Time: 01/28/24  6:12 PM   Specimen: Joint, Other; Body Fluid  Result Value Ref Range Status   Specimen Description FLUID RIGHT KNEE  Final   Special Requests NONE  Final   Gram Stain   Final    FEW WBC PRESENT, PREDOMINANTLY PMN NO ORGANISMS SEEN    Culture   Final    RARE STAPHYLOCOCCUS AUREUS NO ANAEROBES ISOLATED Performed at [facility], [address], [place], [place] [address]    Report Status 02/02/2024 FINAL  Final   Organism ID, Bacteria STAPHYLOCOCCUS AUREUS  Final      Susceptibility   Staphylococcus  aureus - MIC*    CIPROFLOXACIN <=0.5 SENSITIVE Sensitive     ERYTHROMYCIN <=0.25 SENSITIVE Sensitive     GENTAMICIN <=0.5 SENSITIVE Sensitive     OXACILLIN 0.5 SENSITIVE Sensitive     TETRACYCLINE <=1 SENSITIVE Sensitive     VANCOMYCIN  1 SENSITIVE Sensitive     TRIMETH/SULFA <=10 SENSITIVE Sensitive     CLINDAMYCIN <=0.25 SENSITIVE Sensitive     RIFAMPIN <=0.5 SENSITIVE Sensitive     Inducible Clindamycin NEGATIVE Sensitive     LINEZOLID 2 SENSITIVE Sensitive     * RARE STAPHYLOCOCCUS AUREUS   Imaging  US  EKG SITE RITE Result Date: 01/30/2024 If Site Rite image not attached, placement could not be confirmed due to current cardiac rhythm.  DG Knee Complete 4 Views Right Result Date: 01/28/2024 EXAM: 4 OR MORE VIEW(S) Xray of the right knee 01/28/2024 09:36:00 AM COMPARISON: None available. CLINICAL HISTORY: Pain FINDINGS: BONES AND JOINTS: No acute fracture. No focal osseous lesion. No joint dislocation. No significant joint effusion. No significant degenerative changes. SOFT TISSUES: The soft tissues are unremarkable. IMPRESSION: 1. No significant abnormality. Electronically signed by: Lynwood Seip MD 01/28/2024 09:53 AM EST RP Workstation: [id number]   VAS US  LOWER EXTREMITY VENOUS (DVT) Result Date: 01/27/2024  Lower Venous DVT Study Patient Name:  Madeline Richmond  Date of Exam:   01/27/2024 Medical Rec #: [id number]            Accession #:    [id number] Date of Birth: [birth year]             Patient Gender: F Patient Age:   12 years Exam Location:  [address] Procedure:      VAS US  LOWER EXTREMITY VENOUS (DVT) Referring Phys: EVALENE CITRON --------------------------------------------------------------------------------  Indications: Pain, and Edema.  Other Indications: Recent cortizon shot and fluid removal. Performing Technologist: Devere Dark RVT  Examination Guidelines: A complete evaluation includes B-mode imaging, spectral Doppler, color Doppler, and power Doppler as needed of  all accessible portions of each vessel. Bilateral testing is considered an integral part of a complete examination. Limited examinations for reoccurring indications may be performed as noted. The reflux portion of the exam is performed with the patient in reverse Trendelenburg.  +---------+---------------+---------+-----------+----------+--------------+ RIGHT    CompressibilityPhasicitySpontaneityPropertiesThrombus Aging +---------+---------------+---------+-----------+----------+--------------+ CFV      Full           Yes      Yes                                 +---------+---------------+---------+-----------+----------+--------------+ SFJ      Full           Yes      Yes                                 +---------+---------------+---------+-----------+----------+--------------+ FV Prox  Full           Yes      Yes                                 +---------+---------------+---------+-----------+----------+--------------+ FV Mid   Full           Yes      Yes                                 +---------+---------------+---------+-----------+----------+--------------+ FV DistalFull           Yes      Yes                                 +---------+---------------+---------+-----------+----------+--------------+ PFV      Full           Yes      Yes                                 +---------+---------------+---------+-----------+----------+--------------+ POP      Full           Yes      Yes                                 +---------+---------------+---------+-----------+----------+--------------+ PTV      Full           Yes      Yes                                 +---------+---------------+---------+-----------+----------+--------------+ PERO     Full           Yes      Yes                                 +---------+---------------+---------+-----------+----------+--------------+ Gastroc  Full           Yes  Yes                                  +---------+---------------+---------+-----------+----------+--------------+ GSV      Full           Yes      Yes                                 +---------+---------------+---------+-----------+----------+--------------+ SSV      Full           Yes      Yes                                 +---------+---------------+---------+-----------+----------+--------------+   +----+---------------+---------+-----------+----------+--------------+ LEFTCompressibilityPhasicitySpontaneityPropertiesThrombus Aging +----+---------------+---------+-----------+----------+--------------+ CFV Full           Yes      Yes                                 +----+---------------+---------+-----------+----------+--------------+   Findings reported to [facility] at 11:30.  Summary: RIGHT: - There is no evidence of deep vein thrombosis in the lower extremity. - There is no evidence of superficial venous thrombosis. - There is no evidence of deep vein thrombosis proximal to the inguinal ligament or in the common femoral vein. NOTE: 3.36 x 1.82 flulid collection lateral right knee   *See table(s) above for measurements and observations. Electronically signed by Lonni Gaskins MD on 01/27/2024 at 11:38:58 AM.    Final      Assessment/Plan # RT knee septic arthritis, Native  - in the setting of steroid injection for OA - OP arthrocentesis done which grew MSSA.  - Underwent rt knee arthroscopy. OR cx MSSA.  - Discharged to complete 3 weeks of IV cefazolin  through 12/3 to be followed by 1 week of PO antibiotics.  - still has persistent pain, swelling, warmth   Plan  - continue IV cefazolin , see OPAT - fu with Ortho Tuesday  - Labs today  - Instructed to her to go to ED if fevers, significant swelling, redness, warmth   # Night sweats  - no fevers - blood cultures *2  # Medication monitoring  - Labs as above   I spent 30 minutes involved in face-to-face and non-face-to-face activities for this patient  on the day of the visit. Professional time spent includes the following activities: Preparing to see the patient (review of tests), Obtaining and reviewing separately obtained history (discharge record 11/17, ID notes), Performing a medically appropriate examination and evaluation , Ordering medications/labs, referring and communicating with other health care professionals, Documenting clinical information in the EMR, Independently interpreting results (not separately reported), Communicating results to the patient/family member, Counseling and educating the patient/family member and Care coordination (not separately reported).   Of note, portions of this note may have been created with voice recognition software. While this note has been edited for accuracy, occasional wrong-word or 'sound-a-like' substitutions may have occurred due to the inherent limitations of voice recognition software.   Madeline Joseph, MD [facility] 02/06/2024, 11:15 AM

## 2024-02-07 ENCOUNTER — Ambulatory Visit: Payer: Self-pay | Admitting: Infectious Diseases

## 2024-02-10 ENCOUNTER — Other Ambulatory Visit: Payer: Self-pay

## 2024-02-10 ENCOUNTER — Encounter (HOSPITAL_COMMUNITY): Payer: Self-pay

## 2024-02-10 ENCOUNTER — Ambulatory Visit: Payer: Self-pay

## 2024-02-10 NOTE — Progress Notes (Signed)
 For Anesthesia: PCP - Velna JONELLE Skeeter, MD  Cardiologist - N/A  Bowel Prep reminder:N/A  Chest x-ray - N/A EKG - greater than 1 year Stress Test - N/A ECHO - N/A Cardiac Cath - N/A Pacemaker/ICD device last checked:N/A Pacemaker orders received: N/A Device Rep notified: N/A  Spinal Cord Stimulator:N/A  Sleep Study - N/A CPAP - N/A  Fasting Blood Sugar - N/A Checks Blood Sugar __N/A___ times a day Date and result of last Hgb A1c-N/A  Last dose of GLP1 agonist- N/A GLP1 instructions: Hold 7 days prior to schedule (Hold 24 hours-daily)   Last dose of SGLT-2 inhibitors- N/A SGLT-2 instructions: Hold 72 hours prior to surgery  Blood Thinner Instructions:N/A Last Dose:N/A Time last taken:N/A  Aspirin Instructions: N/A Last Dose: N/A Time last taken:N/A  Activity level: Able to exercise without chest pain and/or shortness of breath     Anesthesia review: N/A  Patient denies shortness of breath, fever, cough and chest pain at PAT appointment   Patient verbalized understanding of instructions that were reviewed over the telephone.

## 2024-02-11 ENCOUNTER — Ambulatory Visit (HOSPITAL_COMMUNITY): Admission: RE | Admit: 2024-02-11 | Discharge: 2024-02-11 | Disposition: A | Discharge status: Home / Self Care

## 2024-02-11 ENCOUNTER — Ambulatory Visit (HOSPITAL_COMMUNITY): Admitting: Certified Registered"

## 2024-02-11 ENCOUNTER — Encounter (HOSPITAL_COMMUNITY): Admission: RE | Disposition: A | Discharge status: Home / Self Care | Payer: Self-pay | Source: Home / Self Care

## 2024-02-11 ENCOUNTER — Encounter (HOSPITAL_COMMUNITY): Payer: Self-pay

## 2024-02-11 DIAGNOSIS — Z79899 Other long term (current) drug therapy: Secondary | ICD-10-CM | POA: Diagnosis not present

## 2024-02-11 DIAGNOSIS — F419 Anxiety disorder, unspecified: Secondary | ICD-10-CM | POA: Diagnosis not present

## 2024-02-11 DIAGNOSIS — K219 Gastro-esophageal reflux disease without esophagitis: Secondary | ICD-10-CM | POA: Insufficient documentation

## 2024-02-11 DIAGNOSIS — F32A Depression, unspecified: Secondary | ICD-10-CM | POA: Diagnosis not present

## 2024-02-11 DIAGNOSIS — M00261 Other streptococcal arthritis, right knee: Secondary | ICD-10-CM | POA: Diagnosis present

## 2024-02-11 HISTORY — PX: SYNOVECTOMY: SHX5180

## 2024-02-11 SURGERY — SYNOVECTOMY
Anesthesia: General | Laterality: Right

## 2024-02-11 MED ORDER — BUPIVACAINE-EPINEPHRINE 0.25% -1:200000 IJ SOLN
INTRAMUSCULAR | Status: DC | PRN
  Administered 2024-02-11: 30 mL

## 2024-02-11 MED ORDER — FENTANYL CITRATE (PF) 100 MCG/2ML IJ SOLN
INTRAMUSCULAR | Status: DC | PRN
  Administered 2024-02-11 (×2): 25 ug via INTRAVENOUS
  Administered 2024-02-11 (×3): 50 ug via INTRAVENOUS

## 2024-02-11 MED ORDER — SODIUM CHLORIDE 0.9 % IR SOLN
Status: DC | PRN
Start: 2024-02-11 — End: 2024-02-11
  Administered 2024-02-11 (×3): 3000 mL

## 2024-02-11 MED ORDER — OXYCODONE HCL 5 MG PO TABS
5.0000 mg | ORAL_TABLET | Freq: Once | ORAL | Status: AC | PRN
  Administered 2024-02-11: 5 mg via ORAL

## 2024-02-11 MED ORDER — PROPOFOL 10 MG/ML IV BOLUS
INTRAVENOUS | Status: DC | PRN
  Administered 2024-02-11: 160 mg via INTRAVENOUS

## 2024-02-11 MED ORDER — DEXAMETHASONE SOD PHOSPHATE PF 10 MG/ML IJ SOLN
INTRAMUSCULAR | Status: DC | PRN
  Administered 2024-02-11: 10 mg via INTRAVENOUS

## 2024-02-11 MED ORDER — BUPIVACAINE-EPINEPHRINE (PF) 0.25% -1:200000 IJ SOLN
INTRAMUSCULAR | Status: AC
  Filled 2024-02-11: qty 30

## 2024-02-11 MED ORDER — METHOCARBAMOL 500 MG PO TABS
ORAL_TABLET | ORAL | Status: AC
Start: 2024-02-11 — End: 2024-02-11
  Filled 2024-02-11: qty 1

## 2024-02-11 MED ORDER — OXYCODONE HCL 5 MG PO TABS
ORAL_TABLET | ORAL | Status: AC
  Filled 2024-02-11: qty 1

## 2024-02-11 MED ORDER — DROPERIDOL 2.5 MG/ML IJ SOLN: 0.6250 mg | Freq: Once | INTRAMUSCULAR | Status: DC | PRN

## 2024-02-11 MED ORDER — METHOCARBAMOL 500 MG PO TABS
500.0000 mg | ORAL_TABLET | Freq: Once | ORAL | Status: AC | PRN
  Administered 2024-02-11: 500 mg via ORAL

## 2024-02-11 MED ORDER — LACTATED RINGERS IV SOLN: INTRAVENOUS | Status: DC

## 2024-02-11 MED ORDER — FENTANYL CITRATE (PF) 100 MCG/2ML IJ SOLN
INTRAMUSCULAR | Status: AC
  Filled 2024-02-11: qty 2

## 2024-02-11 MED ORDER — ORAL CARE MOUTH RINSE: 15.0000 mL | Freq: Once | OROMUCOSAL | Status: AC

## 2024-02-11 MED ORDER — HYDROMORPHONE HCL 1 MG/ML IJ SOLN
INTRAMUSCULAR | Status: AC
  Filled 2024-02-11: qty 2

## 2024-02-11 MED ORDER — ACETAMINOPHEN 10 MG/ML IV SOLN: 1000.0000 mg | Freq: Once | INTRAVENOUS | Status: DC | PRN

## 2024-02-11 MED ORDER — MIDAZOLAM HCL (PF) 2 MG/2ML IJ SOLN: 1.0000 mg | Freq: Once | INTRAMUSCULAR | Status: DC

## 2024-02-11 MED ORDER — FENTANYL CITRATE (PF) 50 MCG/ML IJ SOSY
25.0000 ug | PREFILLED_SYRINGE | INTRAMUSCULAR | Status: DC | PRN
  Administered 2024-02-11 (×3): 50 ug via INTRAVENOUS

## 2024-02-11 MED ORDER — LIDOCAINE HCL (PF) 2 % IJ SOLN
INTRAMUSCULAR | Status: DC | PRN
  Administered 2024-02-11: 80 mg via INTRADERMAL

## 2024-02-11 MED ORDER — HYDROMORPHONE HCL 1 MG/ML IJ SOLN
0.2500 mg | INTRAMUSCULAR | Status: DC | PRN
  Administered 2024-02-11 (×4): 0.5 mg via INTRAVENOUS

## 2024-02-11 MED ORDER — ONDANSETRON HCL 4 MG/2ML IJ SOLN
INTRAMUSCULAR | Status: DC | PRN
  Administered 2024-02-11: 4 mg via INTRAVENOUS

## 2024-02-11 MED ORDER — ONDANSETRON HCL 4 MG/2ML IJ SOLN
INTRAMUSCULAR | Status: AC
  Filled 2024-02-11: qty 2

## 2024-02-11 MED ORDER — LIDOCAINE HCL (PF) 2 % IJ SOLN
INTRAMUSCULAR | Status: AC
  Filled 2024-02-11: qty 5

## 2024-02-11 MED ORDER — MIDAZOLAM HCL 5 MG/5ML IJ SOLN
INTRAMUSCULAR | Status: DC | PRN
  Administered 2024-02-11: 2 mg via INTRAVENOUS

## 2024-02-11 MED ORDER — MIDAZOLAM HCL 2 MG/2ML IJ SOLN
INTRAMUSCULAR | Status: AC
  Filled 2024-02-11: qty 2

## 2024-02-11 MED ORDER — FENTANYL CITRATE (PF) 50 MCG/ML IJ SOSY: 50.0000 ug | PREFILLED_SYRINGE | Freq: Once | INTRAMUSCULAR | Status: DC

## 2024-02-11 MED ORDER — PROPOFOL 10 MG/ML IV BOLUS
INTRAVENOUS | Status: AC
  Filled 2024-02-11: qty 20

## 2024-02-11 MED ORDER — FENTANYL CITRATE (PF) 50 MCG/ML IJ SOSY
PREFILLED_SYRINGE | INTRAMUSCULAR | Status: AC
  Filled 2024-02-11: qty 3

## 2024-02-11 MED ORDER — OXYCODONE HCL 5 MG/5ML PO SOLN: 5.0000 mg | Freq: Once | ORAL | Status: AC | PRN

## 2024-02-11 MED ORDER — CHLORHEXIDINE GLUCONATE 0.12 % MT SOLN
15.0000 mL | Freq: Once | OROMUCOSAL | Status: AC
  Administered 2024-02-11: 15 mL via OROMUCOSAL

## 2024-02-11 SURGICAL SUPPLY — 26 items
BNDG ELASTIC 6INX 5YD STR LF (GAUZE/BANDAGES/DRESSINGS) ×1 IMPLANT
BNDG ELASTIC 6X10 VLCR STRL LF (GAUZE/BANDAGES/DRESSINGS) IMPLANT
CLOTH BEACON ORANGE TIMEOUT ST (SAFETY) ×1 IMPLANT
DISSECTOR 3.8MM X 13CM (MISCELLANEOUS) IMPLANT
DRAPE ARTHROSCOPY W/POUCH 114 (DRAPES) ×1 IMPLANT
DRSG ADAPTIC 3X8 NADH LF (GAUZE/BANDAGES/DRESSINGS) IMPLANT
DRSG EMULSION OIL 3X3 NADH (GAUZE/BANDAGES/DRESSINGS) ×1 IMPLANT
DURAPREP 26ML APPLICATOR (WOUND CARE) ×1 IMPLANT
ELECT REM PT RETURN 15FT ADLT (MISCELLANEOUS) IMPLANT
GAUZE 4X4 16PLY ~~LOC~~+RFID DBL (SPONGE) ×1 IMPLANT
GAUZE PAD ABD 8X10 STRL (GAUZE/BANDAGES/DRESSINGS) IMPLANT
GAUZE SPONGE 4X4 12PLY STRL (GAUZE/BANDAGES/DRESSINGS) ×1 IMPLANT
GLOVE SURG SYN 8.0 PF PI (GLOVE) ×2 IMPLANT
GOWN SPEC L3 XXLG W/TWL (GOWN DISPOSABLE) ×1 IMPLANT
KIT BASIN OR (CUSTOM PROCEDURE TRAY) ×1 IMPLANT
KIT TURNOVER KIT A (KITS) ×1 IMPLANT
MANIFOLD NEPTUNE II (INSTRUMENTS) IMPLANT
PACK ARTHROSCOPY WL (CUSTOM PROCEDURE TRAY) ×1 IMPLANT
PADDING CAST ABS COTTON 4X4 ST (CAST SUPPLIES) ×1 IMPLANT
PADDING CAST COTTON 6X4 STRL (CAST SUPPLIES) ×1 IMPLANT
PENCIL SMOKE EVACUATOR (MISCELLANEOUS) IMPLANT
SUT ETHILON 4 0 PS 2 18 (SUTURE) ×1 IMPLANT
TOWEL GREEN STERILE FF (TOWEL DISPOSABLE) ×1 IMPLANT
TOWEL OR DSP ST BLU DLX 10/PK (DISPOSABLE) ×1 IMPLANT
TUBING CONNECTING 10 (TUBING) IMPLANT
WATER STERILE IRR 500ML POUR (IV SOLUTION) ×1 IMPLANT

## 2024-02-11 NOTE — H&P (Signed)
 Orthopaedic Service H&P/Consult     Patient ID: Madeline Richmond MRN: [id number] DOB/AGE: 04-02-1960 63 y.o.  Chief Complaint: Right septic knee HPI: Madeline Richmond is an 63 y.o. female.  With history of prior knee infection.  She had previously washed out approximately 2 weeks ago.  She was on IV antibiotics.  However she continued significant effusions in the right knee.  Recent aspiration showed 84,000 white cells.  Therefore elected proceed with repeat irrigation debridement.  The patient presents today for that surgery  Past Medical History:  Diagnosis Date   Anxiety    Arthritis    knee   Depression    Eczema    GERD (gastroesophageal reflux disease)    H/O seasonal allergies     Past Surgical History:  Procedure Laterality Date   BREAST EXCISIONAL BIOPSY Left 1996   benign   CHOLECYSTECTOMY N/A 05/09/2023   Procedure: LAPAROSCOPIC CHOLECYSTECTOMY;  Surgeon: Vernetta Berg, MD;  [facility];  Service: General;  Laterality: N/A;   COLONOSCOPY     EYE SURGERY     strabismus   KNEE ARTHROSCOPY Right 01/28/2024   Procedure: ARTHROSCOPY, KNEE;  Surgeon: Reyne Cordella SQUIBB, MD;  Location: [facility] OR;  Service: Orthopedics;  Laterality: Right;   REFRACTIVE SURGERY      Family History  Problem Relation Age of Onset   Arthritis Mother    Prostate cancer Father 70       metastatic   Drug abuse Father    Hypertension Father    Breast cancer Sister 73   Melanoma Brother    Social History:  reports that she has never smoked. She has never used smokeless tobacco. She reports current alcohol use of about 7.0 standard drinks of alcohol per week. She reports that she does not use drugs.  Allergies:  Allergies  Allergen Reactions   Codeine Camsylate [Codeine] Nausea And Vomiting    Medications Prior to Admission  Medication Sig Dispense Refill   ceFAZolin  (ANCEF ) IVPB Inject 2 g into the vein every 8 (eight) hours for 16 days. Indication:  Septic arthritis of knee   First Dose: Yes Last Day of Therapy:  02/18/24 Labs - Once weekly:  CBC/D and BMP, Labs - Once weekly: ESR and CRP Method of administration: IV Push Method of administration may be changed at the discretion of home infusion pharmacist based upon assessment of the patient and/or caregiver's ability to self-administer the medication ordered. 48 Units 0   oxyCODONE -acetaminophen  (PERCOCET) 5-325 MG tablet Take 1 tablet by mouth every 6 (six) hours as needed for moderate pain (pain score 4-6) or severe pain (pain score 7-10).     FLUoxetine  (PROZAC ) 40 MG capsule Take 40 mg by mouth in the morning. (Patient not taking: Reported on 02/10/2024)     folic acid  (FOLVITE ) 1 MG tablet Take 1 tablet (1 mg total) by mouth daily. (Patient not taking: Reported on 02/10/2024) 30 tablet 0   HYDROcodone -acetaminophen  (NORCO/VICODIN) 5-325 MG tablet Take 1 tablet by mouth every 6 (six) hours as needed for moderate pain (pain score 4-6) or severe pain (pain score 7-10). (Patient not taking: Reported on 02/10/2024)     Multiple Vitamin (MULTIVITAMIN WITH MINERALS) TABS tablet Take 1 tablet by mouth daily. (Patient not taking: Reported on 02/10/2024) 30 tablet 0   polyethylene glycol powder (GLYCOLAX /MIRALAX ) 17 GM/SCOOP powder Take 17 g by mouth daily. Dissolve 1 capful (17g) in 4-8 ounces of liquid and take by mouth daily. (Patient not taking: Reported on 02/10/2024)  238 g 0   senna (SENOKOT) 8.6 MG TABS tablet Take 1 tablet (8.6 mg total) by mouth daily. (Patient not taking: Reported on 02/10/2024) 120 tablet 0   thiamine  (VITAMIN B1) 100 MG tablet Take 1 tablet (100 mg total) by mouth daily. (Patient not taking: Reported on 02/10/2024) 30 tablet 0    No results found for this or any previous visit (from the past 48 hours). No results found.    Blood pressure 119/75, pulse 75, temperature 98.9 F (37.2 C), temperature source Oral, resp. rate 18, height 5' 7 (1.702 m), weight 65.8 kg, SpO2 99%. Well-appearing  woman in no acute distress Alert and oriented Mood is calm Heart rate is regular on the monitor Breathing comfortably on room air Abdomen is soft Swelling of the right knee with palpable effusion.  Warm to touch.    Assessment/Plan  Septic right knee  The patient is currently being treated for septic right knee but has not made significant progress despite previous I&D and IV antibiotics.  Therefore we will proceed with a repeat I&D today.  We previously discussed the risks including bleeding fadrozole infection, need for additional surgeries.  Informed signs obtained.  The right leg was marked.   Cordella Rhein, MD, MS [facility] [phone]

## 2024-02-11 NOTE — Anesthesia Postprocedure Evaluation (Signed)
 Anesthesia Post Note  Patient: Madeline Richmond  Procedure(s) Performed: ARTHROSCOPIC DEBRIDEMENT RIGHT KNEE, SYNOVECTOMY (Right)     Patient location during evaluation: PACU Anesthesia Type: General Level of consciousness: awake and alert Pain management: pain level controlled Vital Signs Assessment: post-procedure vital signs reviewed and stable Respiratory status: spontaneous breathing, nonlabored ventilation, respiratory function stable and patient connected to nasal cannula oxygen Cardiovascular status: blood pressure returned to baseline and stable Postop Assessment: no apparent nausea or vomiting Anesthetic complications: no   No notable events documented.  Last Vitals:  Vitals:   02/11/24 1445 02/11/24 1515  BP: (!) 126/58 (!) 114/56  Pulse: 70 66  Resp: 12   Temp:    SpO2: 92% 95%    Last Pain:  Vitals:   02/11/24 1515  TempSrc:   PainSc: Asleep                 Thom JONELLE Peoples

## 2024-02-11 NOTE — Anesthesia Preprocedure Evaluation (Signed)
 Anesthesia Evaluation  Patient identified by MRN, date of birth, ID band Patient awake    Reviewed: Allergy & Precautions, NPO status , Patient's Chart, lab work & pertinent test results  History of Anesthesia Complications Negative for: history of anesthetic complications  Airway Mallampati: II  TM Distance: >3 FB Neck ROM: Full   Comment: Previous grade II view with Miller 2, easy mask Dental  (+) Dental Advisory Given   Pulmonary neg pulmonary ROS   Pulmonary exam normal breath sounds clear to auscultation       Cardiovascular (-) hypertension(-) angina (-) Past MI, (-) Cardiac Stents and (-) CABG (-) dysrhythmias  Rhythm:Regular Rate:Normal  HLD   Neuro/Psych neg Seizures PSYCHIATRIC DISORDERS Anxiety Depression    negative neurological ROS     GI/Hepatic Neg liver ROS,GERD  Medicated,,  Endo/Other  negative endocrine ROS    Renal/GU negative Renal ROS     Musculoskeletal  (+) Arthritis ,   streptococcal arthritis   Abdominal   Peds  Hematology negative hematology ROS (+) Lab Results      Component                Value               Date                      WBC                      12.1 (H)            01/28/2024                HGB                      12.3                01/28/2024                HCT                      37.6                01/28/2024                MCV                      91.7                01/28/2024                PLT                      240                 01/28/2024              Anesthesia Other Findings   Reproductive/Obstetrics                              Anesthesia Physical Anesthesia Plan  ASA: 2  Anesthesia Plan: General   Post-op Pain Management:    Induction:   PONV Risk Score and Plan: 3 and Ondansetron , Dexamethasone , Midazolam  and Treatment may vary due to age or medical condition  Airway Management Planned: LMA  Additional Equipment:    Intra-op Plan:   Post-operative Plan: Extubation in  OR  Informed Consent: I have reviewed the patients History and Physical, chart, labs and discussed the procedure including the risks, benefits and alternatives for the proposed anesthesia with the patient or authorized representative who has indicated his/her understanding and acceptance.     Dental advisory given  Plan Discussed with: CRNA and Anesthesiologist  Anesthesia Plan Comments: (Risks of general anesthesia discussed including, but not limited to, sore throat, hoarse voice, chipped/damaged teeth, injury to vocal cords, nausea and vomiting, allergic reactions, lung infection, heart attack, stroke, and death. All questions answered. )         Anesthesia Quick Evaluation

## 2024-02-11 NOTE — Op Note (Signed)
 DATE OF SURGERY:  02/11/2024  TIME: 12:43 PM  PATIENT NAME:  Aries Kasa Raso  AGE: 63 y.o.  PRE-OPERATIVE DIAGNOSIS:  Other streptococcal arthritis, right knee  POST-OPERATIVE DIAGNOSIS:  SAME  PROCEDURE:  ARTHROSCOPIC DEBRIDEMENT RIGHT KNEE, SYNOVECTOMY  SURGEON:  Cordella SHAUNNA Rhein  ASSISTANT: None  OPERATIVE IMPLANTS:  * No implants in log *  UNIQUE ASPECTS OF THE CASE: None  ESTIMATED BLOOD LOSS: Minimal  PREOPERATIVE INDICATIONS:  Leanore Biggers is a 63 y.o. year old who fell and suffered an Other streptococcal arthritis, right knee. She was previously undergone an arthroscopic debridement and was on antibiotics.  However she continues significant effusions.  Aspiration showed 86,000 white cells.  Therefore elected proceed with a repeat debridement.  The risks benefits and alternatives were discussed with the patient including but not limited to the risks of nonoperative treatment, versus surgical intervention including failure to resolve infection, bleeding, nerve injury,blood clots, cardiopulmonary complications, morbidity, mortality, among others, and they were willing to proceed.    OPERATIVE PROCEDURE:  The patient was brought to the operating room and placed in the supine position. Anesthesia was administered. She was placed on the operating room table.  She underwent general endotracheal ovation without complication.  With the patient supine on the table.  The right leg was prepped and draped in usual sterile fashion.  A timeout procedure was performed  We used the previous portals.  First on the medial side, incised with an 11 blade.  The trocar was advanced.  We then placed a second trocar in the lateral portal using direct imaging through the arthroscopy.  Upon entering, there is no significant effusion.  There was however significant hyperemic synovium noted throughout the joint.  Dannielle was used to debride the synovium.  This was done in both the medial and  lateral compartments as well as in the suprapatellar area.  This was accomplished using a shaver.  Arthritic changes were again noted prickly in the patella as well as the medial femoral condyle and the lateral tibial plateau.  This was similar to previously seen.  Some slight fraying of the lateral meniscus but otherwise meniscus was intact.  Once we had adequate debriding the synovium, we copiously irrigated the wound with 4 L of fluid.  The wound was then closed with 4-0 nylon's.  This was dressed with Xeroform 4 x 4's soft roll and an Ace wrap.  The patient woke anesthesia and transferred to the recovery in stable condition.    Cordella Rhein, MD, MS [facility] / [place] [phone]

## 2024-02-11 NOTE — Anesthesia Procedure Notes (Signed)
 Procedure Name: LMA Insertion Date/Time: 02/11/2024 11:25 AM  Performed by: Mackie Holness D, CRNAPre-anesthesia Checklist: Patient identified, Emergency Drugs available, Suction available and Patient being monitored Patient Re-evaluated:Patient Re-evaluated prior to induction Oxygen Delivery Method: Circle system utilized Preoxygenation: Pre-oxygenation with 100% oxygen Induction Type: IV induction Ventilation: Mask ventilation without difficulty LMA: LMA inserted LMA Size: 4.0 Tube type: Oral Number of attempts: 1 Placement Confirmation: positive ETCO2 and breath sounds checked- equal and bilateral Tube secured with: Tape Dental Injury: Teeth and Oropharynx as per pre-operative assessment

## 2024-02-11 NOTE — Discharge Instructions (Signed)
 May remove Band-Aids in 2 days  May shower in 2 days  No baths or pools for 2 weeks  May weight-bear as tolerated  With IV antibiotics per infectious disease  Follow-up in 1 to 2 weeks  Cordella Rhein, MD, MS [facility]ist / [place] [phone]

## 2024-02-11 NOTE — Transfer of Care (Signed)
 Immediate Anesthesia Transfer of Care Note  Patient: Madeline Richmond  Procedure(s) Performed: ARTHROSCOPIC DEBRIDEMENT RIGHT KNEE, SYNOVECTOMY (Right)  Patient Location: PACU  Anesthesia Type:General  Level of Consciousness: awake, alert , and oriented  Airway & Oxygen Therapy: Patient Spontanous Breathing and Patient connected to face mask oxygen  Post-op Assessment: Report given to RN and Post -op Vital signs reviewed and stable  Post vital signs: Reviewed and stable  Last Vitals:  Vitals Value Taken Time  BP 106/48 02/11/24 12:48  Temp    Pulse 29 02/11/24 12:48  Resp 14 02/11/24 12:50  SpO2 88 % 02/11/24 12:48  Vitals shown include unfiled device data.  Last Pain:  Vitals:   02/11/24 0925  TempSrc: Oral  PainSc:          Complications: No notable events documented.

## 2024-02-12 ENCOUNTER — Encounter (HOSPITAL_COMMUNITY): Payer: Self-pay

## 2024-02-12 LAB — CBC
HCT: 33.2 % — ABNORMAL LOW (ref 35.0–45.0)
Hemoglobin: 11 g/dL — ABNORMAL LOW (ref 11.7–15.5)
MCH: 29.6 pg (ref 27.0–33.0)
MCHC: 33.1 g/dL (ref 32.0–36.0)
MCV: 89.5 fL (ref 80.0–100.0)
MPV: 9.4 fL (ref 7.5–12.5)
Platelets: 613 Thousand/uL — ABNORMAL HIGH (ref 140–400)
RBC: 3.71 Million/uL — ABNORMAL LOW (ref 3.80–5.10)
RDW: 11.7 % (ref 11.0–15.0)
WBC: 10.8 Thousand/uL (ref 3.8–10.8)

## 2024-02-12 LAB — CULTURE, BLOOD (SINGLE)
MICRO NUMBER:: 17272514
MICRO NUMBER:: 17272515
Result:: NO GROWTH
SPECIMEN QUALITY:: ADEQUATE

## 2024-02-12 LAB — BASIC METABOLIC PANEL WITH GFR
BUN: 18 mg/dL (ref 7–25)
CO2: 28 mmol/L (ref 20–32)
Calcium: 9.1 mg/dL (ref 8.6–10.4)
Chloride: 98 mmol/L (ref 98–110)
Creat: 0.53 mg/dL (ref 0.50–1.05)
Glucose, Bld: 96 mg/dL (ref 65–99)
Potassium: 4.6 mmol/L (ref 3.5–5.3)
Sodium: 136 mmol/L (ref 135–146)
eGFR: 103 mL/min/1.73m2 (ref >=60)

## 2024-02-12 LAB — C-REACTIVE PROTEIN: CRP: 116 mg/L — ABNORMAL HIGH (ref <8.0)

## 2024-02-12 LAB — SEDIMENTATION RATE: Sed Rate: 119 mm/h — ABNORMAL HIGH (ref 0–30)

## 2024-02-18 ENCOUNTER — Telehealth: Payer: Self-pay

## 2024-02-18 NOTE — Telephone Encounter (Signed)
 Patient called to follow up on [facility] orders; was told by RN that this week would be last visit with her. Was not sure if picc line would be removed Monday or if provider had plans to extend antibiotics.  Was told by her surgeon that she would likely need additional antibiotics after the 8th. Pt would like to know if orders can be sent to [facility]  Informed pt that provider would address next steps at appointment, but would check with them to see if orders can be sent before appointment.  Lorenda CHRISTELLA Code, RMA

## 2024-02-18 NOTE — Telephone Encounter (Signed)
 I would need to see pt as not seen them before. Will defer to dr. Dea

## 2024-02-23 ENCOUNTER — Ambulatory Visit: Admitting: Internal Medicine

## 2024-02-23 ENCOUNTER — Encounter: Payer: Self-pay | Admitting: Internal Medicine

## 2024-02-23 ENCOUNTER — Telehealth: Payer: Self-pay

## 2024-02-23 ENCOUNTER — Other Ambulatory Visit: Payer: Self-pay

## 2024-02-23 VITALS — BP 112/67 | HR 87 | Temp 98.2°F

## 2024-02-23 DIAGNOSIS — B9562 Methicillin resistant Staphylococcus aureus infection as the cause of diseases classified elsewhere: Secondary | ICD-10-CM

## 2024-02-23 NOTE — Patient Instructions (Signed)
 EOT  of IV antibiotics till next appt on 12/16

## 2024-02-23 NOTE — Telephone Encounter (Signed)
 Patient will be extending IV abx until 03/02/24 when we see her again on 03/02/24 per Dr. Dennise. I have informed [facility] of IV abx ext.  Madeline Richmond, CMA

## 2024-02-23 NOTE — Progress Notes (Signed)
 Patient: Alletta Mattos  DOB: 11/28/1960 MRN: [id number] PCP: Vernon Velna SAUNDERS, MD    Chief Complaint  Patient presents with   Follow-up     Patient Active Problem List   Diagnosis Date Noted   Medication monitoring encounter 02/06/2024   PICC (peripherally inserted central catheter) in place 02/06/2024   MSSA (methicillin susceptible Staphylococcus aureus) infection 02/06/2024   Septic arthritis (HCC) 01/28/2024   Leukocytosis 01/28/2024   Hyponatremia 01/28/2024   Genetic testing 03/19/2022   At high risk for breast cancer 03/19/2022   Family history of breast cancer 02/28/2022   Family history of prostate cancer in father 02/28/2022   Allergic rhinitis 09/24/2021   Decreased estrogen level 09/24/2021   Diarrhea 09/24/2021   Hemorrhoids without complication 09/24/2021   Irritable bowel syndrome 09/24/2021   Knee pain 09/24/2021   Perimenopausal 09/24/2021   History of colonic polyps 09/24/2021   Pure hypercholesterolemia 09/24/2021   Skin sensation disturbance 09/24/2021   Osteoarthritis of left knee 08/29/2017   Anxiety and depression 04/22/2017   Sleep disorder, circadian, 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 F with with past medical history as below presents for follow-up of right native septic knee arthritis.  Patient was last seen by Dr. Dea on 02/06/2024 please see HPI for further details.  Patient states that the knee is still swollen, she has been going to PT.  She has not been taking Percocet.  She does take Tylenol  which has limited effect on her pain. [name] with h/o anxiety/depression, alcohol use withh/o steroid injection who is here for recent hospital admission 11/12-11/17 for rt knee MSSA septic  arthritis. Patient had OP arthrocentesis done which grew MSSA. Underwent rt knee arthroscopy. OR cx MSSA. Discharged to complete 3 weeks of IV cefazolin  through 12/3 to be followed by 1 week of PO antibiotics.    11/21 Accompanied by family member. Receiving IV antibiotics through PICC line with no concerns related to PICC or antibiotics except some loose stool. Saw Orthopedics yesterday for worsening rt knee pain and swelling, they had fluid aspirated and sent for further testing and have a fu on Tuesday. She was also started on percocet for pain. No fevers but night sweats last few days. She is walking with walker. : Review of Systems  All other systems reviewed and are negative.   Past Medical History:  Diagnosis Date   Anxiety    Arthritis    knee   Depression    Eczema    GERD (gastroesophageal reflux disease)    H/O seasonal allergies     Outpatient Medications Prior to Visit  Medication Sig Dispense Refill   FLUoxetine  (PROZAC ) 40 MG capsule Take 40 mg by mouth in the morning.     folic acid  (FOLVITE ) 1 MG tablet Take 1 tablet (1 mg total) by mouth daily. 30 tablet 0   Multiple Vitamin (MULTIVITAMIN WITH MINERALS) TABS tablet Take 1 tablet by mouth daily. 30 tablet 0   oxyCODONE -acetaminophen  (PERCOCET) 5-325 MG tablet Take 1 tablet by mouth every 6 (six) hours as needed for moderate pain (pain score 4-6) or severe pain (pain score 7-10).     polyethylene glycol powder (GLYCOLAX /MIRALAX ) 17 GM/SCOOP powder Take 17 g by  mouth daily. Dissolve 1 capful (17g) in 4-8 ounces of liquid and take by mouth daily. 238 g 0   senna (SENOKOT) 8.6 MG TABS tablet Take 1 tablet (8.6 mg total) by mouth daily. 120 tablet 0   thiamine  (VITAMIN B1) 100 MG tablet Take 1 tablet (100 mg total) by mouth daily. 30 tablet 0   HYDROcodone -acetaminophen  (NORCO/VICODIN) 5-325 MG tablet Take 1 tablet by mouth every 6 (six) hours as needed for moderate pain (pain score 4-6) or severe pain (pain score 7-10).  (Patient not taking: Reported on 02/23/2024)     No facility-administered medications prior to visit.     Allergies  Allergen Reactions   Codeine Camsylate [Codeine] Nausea And Vomiting    Social History   Tobacco Use   Smoking status: Never   Smokeless tobacco: Never  Vaping Use   Vaping status: Never Used  Substance Use Topics   Alcohol use: Yes    Alcohol/week: 7.0 standard drinks of alcohol    Types: 7 Glasses of wine per week   Drug use: No    Family History  Problem Relation Age of Onset   Arthritis Mother    Prostate cancer Father 61       metastatic   Drug abuse Father    Hypertension Father    Breast cancer Sister 60   Melanoma Brother     Objective:  There were no vitals filed for this visit. There is no height or weight on file to calculate BMI.  Physical Exam Constitutional:      Appearance: Normal appearance.  HENT:     Head: Normocephalic and atraumatic.     Right Ear: Tympanic membrane normal.     Left Ear: Tympanic membrane normal.     Nose: Nose normal.     Mouth/Throat:     Mouth: Mucous membranes are moist.  Eyes:     Extraocular Movements: Extraocular movements intact.     Conjunctiva/sclera: Conjunctivae normal.     Pupils: Pupils are equal, round, and reactive to light.  Cardiovascular:     Rate and Rhythm: Normal rate and regular rhythm.     Heart sounds: No murmur heard.    No friction rub. No gallop.  Pulmonary:     Effort: Pulmonary effort is normal.     Breath sounds: Normal breath sounds.  Abdominal:     General: Abdomen is flat.     Palpations: Abdomen is soft.  Skin:    General: Skin is warm and dry.  Neurological:     General: No focal deficit present.     Mental Status: She is alert and oriented to person, place, and time.  Psychiatric:        Mood and Affect: Mood normal.     Lab Results: Lab Results  Component Value Date   WBC 10.8 02/06/2024   HGB 11.0 (L) 02/06/2024   HCT 33.2 (L) 02/06/2024   MCV 89.5  02/06/2024   PLT 613 (H) 02/06/2024    Lab Results  Component Value Date   CREATININE 0.53 02/06/2024   BUN 18 02/06/2024   NA 136 02/06/2024   K 4.6 02/06/2024   CL 98 02/06/2024   CO2 28 02/06/2024    Lab Results  Component Value Date   ALT 17 05/05/2023   AST 17 05/05/2023   ALKPHOS 66 05/05/2023   BILITOT 0.6 05/05/2023     Assessment & Plan:  # RT knee septic arthritis, Native  - in the setting of  steroid injection for OA - OP arthrocentesis done which grew MSSA.  - Underwent rt knee arthroscopy. OR cx MSSA. - Patient on 4 weeks cefazolin  through 12/8 12/2 Orthonoted slow and steady progress.  Noted the synovium planned during that visit, no effusion. -not taking percocet,, taking advid -sees ortho tomorrow Plan - Rt knee is still a bit more swollen when compared to the other knee, range of motion somewhat limited.SABRA Extend abx till next week as knee still inflamed, esr.crp elevated. Will reasess in a week once pt has seen ortho and additonal week of abx.  #PICC line #Medication management 12/3 WBC 10K, SCR 0.53, ESR 46, CRP 80 Continue cefazolin  till 12/16( next ID appointment Dr. Gerrianne)  OPAT ORDERS:  Diagnosis: Rt knee septic arthritis   Culture Result: MSSA  Allergies  Allergen Reactions   Codeine Camsylate [Codeine] Nausea And Vomiting     Discharge antibiotics to be given via PICC line:  Per pharmacy protocol  cefazolin  2gm iv q8h End Date: 12/16  [facility] Care Per Protocol with Biopatch Use: Home health RN for IV administration and teaching, line care and labs.    Labs weekly while on IV antibiotics: _x_ CBC with differential __ BMP **TWICE WEEKLY ON VANCOMYCIN   __x CMP _x_ CRP __x ESR __ Vancomycin  trough TWICE WEEKLY __ CK  __ Please pull PIC at completion of IV antibiotics _x_ Please leave PIC in place until doctor has seen patient or been notified  Fax weekly labs to [phone]  Clinic Follow Up Appt: Dr. Dea  @ [facility]  with 12/16  Loney Stank, MD [facility]   02/23/24  1:05 PM I personally spent a total of 60 minutes in the care of the patient today including preparing to see the patient, getting/reviewing separately obtained history, performing a medically appropriate exam/evaluation, counseling and educating, placing orders, documenting clinical information in the EHR, independently interpreting results, and communicating results.

## 2024-03-02 ENCOUNTER — Other Ambulatory Visit: Payer: Self-pay

## 2024-03-02 ENCOUNTER — Encounter: Payer: Self-pay | Admitting: Infectious Diseases

## 2024-03-02 ENCOUNTER — Telehealth: Payer: Self-pay

## 2024-03-02 ENCOUNTER — Ambulatory Visit: Admitting: Infectious Diseases

## 2024-03-02 VITALS — BP 142/72 | HR 67 | Temp 98.0°F | Ht 68.0 in | Wt 153.0 lb

## 2024-03-02 DIAGNOSIS — Z5181 Encounter for therapeutic drug level monitoring: Secondary | ICD-10-CM

## 2024-03-02 DIAGNOSIS — M00061 Staphylococcal arthritis, right knee: Secondary | ICD-10-CM

## 2024-03-02 DIAGNOSIS — Z452 Encounter for adjustment and management of vascular access device: Secondary | ICD-10-CM | POA: Insufficient documentation

## 2024-03-02 NOTE — Telephone Encounter (Signed)
 Thank you :)

## 2024-03-02 NOTE — Progress Notes (Unsigned)
 Reason for Fu: septic arthritis   Patient Active Problem List   Diagnosis Date Noted   Peripherally inserted central catheter (PICC) in place 03/02/2024   Medication monitoring encounter 02/06/2024   PICC (peripherally inserted central catheter) in place 02/06/2024   MSSA (methicillin susceptible Staphylococcus aureus) infection 02/06/2024   Septic arthritis (HCC) 01/28/2024   Leukocytosis 01/28/2024   Hyponatremia 01/28/2024   Genetic testing 03/19/2022   At high risk for breast cancer 03/19/2022   Family history of breast cancer 02/28/2022   Family history of prostate cancer in father 02/28/2022   Allergic rhinitis 09/24/2021   Decreased estrogen level 09/24/2021   Diarrhea 09/24/2021   Hemorrhoids without complication 09/24/2021   Irritable bowel syndrome 09/24/2021   Knee pain 09/24/2021   Perimenopausal 09/24/2021   History of colonic polyps 09/24/2021   Pure hypercholesterolemia 09/24/2021   Skin sensation disturbance 09/24/2021   Osteoarthritis of left knee 08/29/2017   Anxiety and depression 04/22/2017   Sleep disorder, circadian, 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   FOLIC ACID  (FOLVITE ) 1 MG TABLET    Take 1 tablet (1 mg total) by mouth daily.   MULTIPLE VITAMIN (MULTIVITAMIN WITH MINERALS) TABS TABLET    Take 1 tablet by mouth daily.   OXYCODONE -ACETAMINOPHEN  (PERCOCET) 5-325 MG TABLET    Take 1 tablet by mouth every 6 (six) hours as needed for moderate pain (pain score 4-6) or severe pain (pain score 7-10).   POLYETHYLENE GLYCOL POWDER (GLYCOLAX /MIRALAX ) 17 GM/SCOOP POWDER    Take 17 g by mouth daily.  Dissolve 1 capful (17g) in 4-8 ounces of liquid and take by mouth daily.   SENNA (SENOKOT) 8.6 MG TABS TABLET    Take 1 tablet (8.6 mg total) by mouth daily.   THIAMINE  (VITAMIN B1) 100 MG TABLET    Take 1 tablet (100 mg total) by mouth daily.  Modified Medications   No medications on file  Discontinued Medications   No medications on file    Subjective: Discussed the use of AI scribe software for clinical note transcription with the patient, who gave verbal consent to proceed.   [name] with h/o anxiety/depression, alcohol use withh/o steroid injection who is here for recent hospital admission 11/12-11/17 for rt knee MSSA septic arthritis. Patient had OP arthrocentesis done which grew MSSA. Underwent rt knee arthroscopy. OR cx MSSA. Discharged to complete 3 weeks of IV cefazolin  through 12/3 to be followed by 1 week of PO antibiotics.   11/21 Accompanied by family member. Receiving IV antibiotics through PICC line with no concerns related to PICC or antibiotics except some loose stool. Saw Orthopedics yesterday for worsening rt knee pain and swelling, they had fluid aspirated and sent for further testing and have a fu on Tuesday. She was also started on percocet for pain. No fevers but night sweats last few days. She is walking with walker.   12/16 Saw Ortho last week, does not want to remove sutures yet. Came from PT. Still some swelling and tender, no concerns with abtx. IV antibiotics extend through 03/24/24. Fu in 2 weeks. HH ith weekly labs ?  Review of Systems: all systems reviewed with pertinent positives and negatives as listed above   Past Medical History:  Diagnosis Date   Anxiety    Arthritis    knee   Depression    Eczema    GERD (gastroesophageal reflux disease)    H/O seasonal allergies    Past Surgical History:  Procedure Laterality Date   BREAST EXCISIONAL BIOPSY Left 1996   benign   CHOLECYSTECTOMY N/A 05/09/2023   Procedure: LAPAROSCOPIC CHOLECYSTECTOMY;   Surgeon: Vernetta Berg, MD;  Location: [facility] OR;  Service: General;  Laterality: N/A;   COLONOSCOPY     EYE SURGERY     strabismus   KNEE ARTHROSCOPY Right 01/28/2024   Procedure: ARTHROSCOPY, KNEE;  Surgeon: Reyne Cordella SQUIBB, MD;  Location: [facility] OR;  Service: Orthopedics;  Laterality: Right;   REFRACTIVE SURGERY     SYNOVECTOMY Right 02/11/2024   Procedure: ARTHROSCOPIC DEBRIDEMENT RIGHT KNEE, SYNOVECTOMY;  Surgeon: Reyne Cordella SQUIBB, MD;  Location: [facility] ORS;  Service: Orthopedics;  Laterality: Right;    Social History   Tobacco Use   Smoking status: Never   Smokeless tobacco: Never  Vaping Use   Vaping status: Never Used  Substance Use Topics   Alcohol use: Yes    Alcohol/week: 7.0 standard drinks of alcohol    Types: 7 Glasses of wine per week   Drug use: No    Family History  Problem Relation Age of Onset   Arthritis Mother    Prostate cancer Father 37       metastatic   Drug abuse Father    Hypertension Father    Breast cancer Sister 3   Melanoma Brother     Allergies  Allergen Reactions   Codeine Camsylate [Codeine] Nausea And Vomiting    Health Maintenance  Topic Date Due   Zoster Vaccines- Shingrix (2 of 2) 06/22/2019   Pneumococcal Vaccine: 50+ Years (2 of 2 - PCV) 01/21/2020   Mammogram  12/07/2025   Cervical Cancer Screening (HPV/Pap Cotest)  12/19/2026   Colonoscopy  03/16/2029   DTaP/Tdap/Td (2 - Td or Tdap) 06/22/2031   Influenza Vaccine  Completed   COVID-19 Vaccine  Completed   Hepatitis C Screening  Completed   HIV Screening  Completed   Hepatitis B Vaccines 19-59 Average Risk  Aged Out   HPV VACCINES  Aged Out   Meningococcal B Vaccine  Aged Out    Objective: Temp 98 F (36.7 C) (Temporal)   Ht 5' 8 (1.727 m)   Wt 153 lb (69.4 kg)   BMI 23.26 kg/m    Physical Exam Constitutional:      Appearance: Normal appearance.  HENT:     Head: Normocephalic and atraumatic.      Mouth: Mucous membranes are moist.  Eyes:     Conjunctiva/sclera: Conjunctivae normal.     Pupils: Pupils are equal, round, and b/l symmetrical    Cardiovascular:     Rate and Rhythm: Normal rate     Heart sounds: s1s2  Pulmonary:     Effort: Pulmonary effort is normal.     Breath sounds: Normal breath sounds.   Abdominal:     General: Non distended     Palpations: soft.   Musculoskeletal:        General: ambulatory without support. Rt knee with mild swelling, no erythema and warmth consistent with post op knee.   Skin:    General: Skin is warm and dry.     Comments: Rt arm PICC with  no signs of infection   Neurological:     General: grossly non focal     Mental Status: awake, alert and oriented to person, place, and time.   Psychiatric:        Mood and Affect: Mood normal.   Lab Results Lab Results  Component Value Date   WBC 10.8 02/06/2024   HGB 11.0 (L) 02/06/2024   HCT 33.2 (L) 02/06/2024   MCV 89.5 02/06/2024   PLT 613 (H) 02/06/2024    Lab Results  Component Value Date   CREATININE 0.53 02/06/2024   BUN 18 02/06/2024   NA 136 02/06/2024   K 4.6 02/06/2024   CL 98 02/06/2024   CO2 28 02/06/2024    Lab Results  Component Value Date   ALT 17 05/05/2023   AST 17 05/05/2023   ALKPHOS 66 05/05/2023   BILITOT 0.6 05/05/2023    No results found for: CHOL, HDL, LDLCALC, LDLDIRECT, TRIG, CHOLHDL No results found for: LABRPR, RPRTITER No results found for: HIV1RNAQUANT, HIV1RNAVL, CD4TABS   Microbiology Results for orders placed or performed in visit on 02/06/24  Blood culture (routine single)     Status: None   Collection Time: 02/06/24 11:42 AM   Specimen: Blood  Result Value Ref Range Status   MICRO NUMBER: [id number]  Final   SPECIMEN QUALITY: Adequate  Final   Source BLOOD 1  Final   STATUS: FINAL  Final   Result: No growth after 5 days  Final   COMMENT: Aerobic and anaerobic bottle received.  Final  Blood culture (routine single)     Status: None   Collection Time:  02/06/24 11:55 AM   Specimen: Blood  Result Value Ref Range Status   MICRO NUMBER: [id number]  Final   SPECIMEN QUALITY: Suboptimal  Final   Source BLOOD 2  Final   STATUS: FINAL  Final   Result:   Final    No growth after 5 days Inspection of blood culture bottles indicates that an inadequate volume of blood may have been collected for the detection of sepsis.   COMMENT: Aerobic and anaerobic bottle received.  Final   Imaging  No results found.    Assessment/Plan # RT knee septic arthritis, Native  - in the setting of steroid injection for OA - OP arthrocentesis done which grew MSSA.  - Underwent rt knee arthroscopy. OR cx MSSA.  - Discharged to complete 3 weeks of IV cefazolin  through 12/3 to be followed by 1 week of PO antibiotics.  - still has persistent pain, swelling, warmth   Plan  - continue IV cefazolin , see OPAT - fu with Ortho Tuesday  - Labs today  - Instructed to her to go to ED if fevers, significant swelling, redness, warmth   # Night sweats  - no fevers - blood cultures *2  # Medication monitoring  - Labs as above   I spent 30 minutes involved in face-to-face and non-face-to-face activities for this patient on the day of the visit. Professional time spent includes the following activities: Preparing to see the patient (review of tests), Obtaining and reviewing separately obtained history (discharge record 11/17, ID notes), Performing a medically appropriate examination and evaluation , Ordering medications/labs, referring and communicating with other health care professionals, Documenting clinical information in the EMR, Independently interpreting results (not separately reported), Communicating results to the patient/family member, Counseling and educating the patient/family member and Care coordination (not separately reported).   Of note, portions of this note may have been  created with voice recognition software. While this note has been edited for accuracy,  occasional wrong-word or sound-a-like substitutions may have occurred due to the inherent limitations of voice recognition software.   Annalee Joseph, MD [facility] 03/02/2024, 2:41 PM

## 2024-03-02 NOTE — Telephone Encounter (Signed)
 Per Dr. Dea extend Iv abx till 1/7. Sent a message to Holley Herring RN at [facility] about orders.

## 2024-03-15 ENCOUNTER — Ambulatory Visit (INDEPENDENT_AMBULATORY_CARE_PROVIDER_SITE_OTHER): Admitting: Infectious Diseases

## 2024-03-15 ENCOUNTER — Other Ambulatory Visit: Payer: Self-pay

## 2024-03-15 ENCOUNTER — Telehealth: Payer: Self-pay

## 2024-03-15 VITALS — BP 127/66 | HR 78 | Temp 99.0°F | Resp 16 | Wt 153.2 lb

## 2024-03-15 DIAGNOSIS — Z5181 Encounter for therapeutic drug level monitoring: Secondary | ICD-10-CM

## 2024-03-15 DIAGNOSIS — Z452 Encounter for adjustment and management of vascular access device: Secondary | ICD-10-CM

## 2024-03-15 DIAGNOSIS — M00061 Staphylococcal arthritis, right knee: Secondary | ICD-10-CM

## 2024-03-15 DIAGNOSIS — R432 Parageusia: Secondary | ICD-10-CM | POA: Insufficient documentation

## 2024-03-15 MED ORDER — LINEZOLID 600 MG PO TABS: 600.0000 mg | ORAL_TABLET | Freq: Two times a day (BID) | ORAL | 0 refills | Status: AC

## 2024-03-15 NOTE — Telephone Encounter (Signed)
 Per Dr.Manandhar - stop IV abx and pull PICC line today. Message sent to [facility].  Madeline Richmond Madeline Richmond, CMA

## 2024-03-15 NOTE — Progress Notes (Signed)
 "  Reason for Fu: septic arthritis   Patient Active Problem List   Diagnosis Date Noted   Peripherally inserted central catheter (PICC) in place 03/02/2024   Medication monitoring encounter 02/06/2024   PICC (peripherally inserted central catheter) in place 02/06/2024   MSSA (methicillin susceptible Staphylococcus aureus) infection 02/06/2024   Septic arthritis (HCC) 01/28/2024   Leukocytosis 01/28/2024   Hyponatremia 01/28/2024   Genetic testing 03/19/2022   At high risk for breast cancer 03/19/2022   Family history of breast cancer 02/28/2022   Family history of prostate cancer in father 02/28/2022   Allergic rhinitis 09/24/2021   Decreased estrogen level 09/24/2021   Diarrhea 09/24/2021   Hemorrhoids without complication 09/24/2021   Irritable bowel syndrome 09/24/2021   Knee pain 09/24/2021   Perimenopausal 09/24/2021   History of colonic polyps 09/24/2021   Pure hypercholesterolemia 09/24/2021   Skin sensation disturbance 09/24/2021   Osteoarthritis of left knee 08/29/2017   Anxiety and depression 04/22/2017   Sleep disorder, circadian, 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   FOLIC ACID  (FOLVITE ) 1 MG TABLET    Take 1 tablet (1 mg total) by mouth daily.   MULTIPLE VITAMIN (MULTIVITAMIN WITH MINERALS) TABS TABLET    Take 1 tablet by mouth daily.   OXYCODONE -ACETAMINOPHEN  (PERCOCET) 5-325 MG TABLET    Take 1 tablet by mouth every 6 (six) hours as needed for moderate pain (pain score 4-6) or severe pain (pain score 7-10).   POLYETHYLENE GLYCOL POWDER (GLYCOLAX /MIRALAX ) 17 GM/SCOOP POWDER    Take 17 g by mouth daily.  Dissolve 1 capful (17g) in 4-8 ounces of liquid and take by mouth daily.   SENNA (SENOKOT) 8.6 MG TABS TABLET    Take 1 tablet (8.6 mg total) by mouth daily.   THIAMINE  (VITAMIN B1) 100 MG TABLET    Take 1 tablet (100 mg total) by mouth daily.  Modified Medications   No medications on file  Discontinued Medications   No medications on file    Subjective: [name] with h/o anxiety/depression, alcohol use withh/o steroid injection who is here for recent hospital admission 11/12-11/17 for rt knee MSSA septic arthritis. Patient had OP arthrocentesis done which grew MSSA. Underwent rt knee arthroscopy. OR cx MSSA. Discharged to complete 3 weeks of IV cefazolin  through 12/3 to be followed by 1 week of PO antibiotics.   11/21 Accompanied by family member. Receiving IV antibiotics through PICC line with no concerns related to PICC or antibiotics except some loose stool. Saw Orthopedics yesterday for worsening rt knee pain and swelling, they had fluid aspirated and sent for further testing and have a fu on Tuesday. She was also started on percocet for pain. No fevers but night sweats last few days. She is walking with walker.   Underwent rt knee arthroscopic debridement, synovectomy on 11/26. No cx sent. Per OR note, no significant effusion.  Significant hyperemic synovium noted throughout the joint.   Seen by orthopedics on 12/2.  Seen by Dr. Dennise on 12/8 and continued on IV cefazolin .  12/16 Accompanied by her husband. Reports continuing IV cefazolin  through PICC without any  concerns or missed doses without any concerns related to PICC or antibiotics. She is doing PT. Still some swelling and tender in rt knee but ambulatory.  Denies fever, chills.  Denies nausea, vomiting or diarrhea.   12/29 Accompanied by husband.  Getting IV cefazolin  through PICC.  Some concerns related to abnormal taste sensation.  She had a reaction after eating lobster in christmas evening when she had nausea, vomiting  which has resolved. Mild pain at the PICC insertion site.  She is following with orthopedics and getting PT. Rt knee feels ok.  She almost fell on her back this morning and she missed a step but no injuries.  Denies fever, chills.  Denies N/V/D.   Review of Systems: all systems reviewed with pertinent positives and negatives as listed above   Past Medical History:  Diagnosis Date   Anxiety    Arthritis    knee   Depression    Eczema    GERD (gastroesophageal reflux disease)    H/O seasonal allergies    Past Surgical History:  Procedure Laterality Date   BREAST EXCISIONAL BIOPSY Left 1996   benign   CHOLECYSTECTOMY N/A 05/09/2023   Procedure: LAPAROSCOPIC CHOLECYSTECTOMY;  Surgeon: Vernetta Berg, MD;  Location: [facility] OR;  Service: General;  Laterality: N/A;   COLONOSCOPY     EYE SURGERY     strabismus   KNEE ARTHROSCOPY Right 01/28/2024   Procedure: ARTHROSCOPY, KNEE;  Surgeon: Reyne Cordella SQUIBB, MD;  Location: [facility] OR;  Service: Orthopedics;  Laterality: Right;   REFRACTIVE SURGERY     SYNOVECTOMY Right 02/11/2024   Procedure: ARTHROSCOPIC DEBRIDEMENT RIGHT KNEE, SYNOVECTOMY;  Surgeon: Reyne Cordella SQUIBB, MD;  Location: [facility] ORS;  Service: Orthopedics;  Laterality: Right;    Social History   Tobacco Use   Smoking status: Never   Smokeless tobacco: Never  Vaping Use   Vaping status: Never Used  Substance Use Topics   Alcohol use: Yes    Alcohol/week: 7.0 standard drinks of alcohol    Types: 7 Glasses of wine per week   Drug use: No    Family History  Problem Relation Age of Onset   Arthritis Mother    Prostate cancer Father 32       metastatic   Drug abuse Father    Hypertension Father    Breast cancer Sister 74   Melanoma Brother     Allergies  Allergen Reactions   Codeine Camsylate [Codeine] Nausea And Vomiting    Health Maintenance  Topic Date Due   Zoster Vaccines- Shingrix (2 of 2) 06/22/2019   Pneumococcal Vaccine: 50+ Years (2 of 2 - PCV) 01/21/2020    Mammogram  12/07/2025   Cervical Cancer Screening (HPV/Pap Cotest)  12/19/2026   Colonoscopy  03/16/2029   DTaP/Tdap/Td (2 - Td or Tdap) 06/22/2031   Influenza Vaccine  Completed   COVID-19 Vaccine  Completed   Hepatitis C Screening  Completed   HIV Screening  Completed   Hepatitis B Vaccines 19-59 Average Risk  Aged Out   HPV VACCINES  Aged Out   Meningococcal B Vaccine  Aged Out    Objective: BP 127/66   Pulse 78   Temp 99 F (37.2 C) (Oral)   Resp 16   Wt 153 lb 3.2 oz (69.5 kg)   SpO2 98%   BMI 23.29 kg/m '  Physical Exam Constitutional:      Appearance: Normal appearance.  HENT:     Head: Normocephalic and atraumatic.  Mouth: Mucous membranes are moist. No thrush, coating Eyes:    Conjunctiva/sclera: Conjunctivae normal.     Pupils: Pupils are equal, round, and b/l symmetrical    Cardiovascular:     Rate and Rhythm: Normal rate     Heart sounds:  Pulmonary:     Effort: Pulmonary effort is normal.     Breath sounds:  Abdominal:     General: Non distended     Palpations:   Musculoskeletal:        General: ambulatory without support. Rt knee with mild swelling as expected in post op knee, no other signs of septic arthritis.   Skin:    General: Skin is warm and dry.     Comments: Rt arm PICC with no signs of infection   Neurological:     General: grossly non focal     Mental Status: awake, alert and oriented  Psychiatric:        Mood and Affect: Mood normal.   Lab Results Lab Results  Component Value Date   WBC 10.8 02/06/2024   HGB 11.0 (L) 02/06/2024   HCT 33.2 (L) 02/06/2024   MCV 89.5 02/06/2024   PLT 613 (H) 02/06/2024    Lab Results  Component Value Date   CREATININE 0.53 02/06/2024   BUN 18 02/06/2024   NA 136 02/06/2024   K 4.6 02/06/2024   CL 98 02/06/2024   CO2 28 02/06/2024    Lab Results  Component Value Date   ALT 17 05/05/2023   AST 17 05/05/2023   ALKPHOS 66 05/05/2023   BILITOT 0.6 05/05/2023    No results  found for: CHOL, HDL, LDLCALC, LDLDIRECT, TRIG, CHOLHDL No results found for: LABRPR, RPRTITER No results found for: HIV1RNAQUANT, HIV1RNAVL, CD4TABS   Microbiology Results for orders placed or performed in visit on 02/06/24  Blood culture (routine single)     Status: None   Collection Time: 02/06/24 11:42 AM   Specimen: Blood  Result Value Ref Range Status   MICRO NUMBER: [id number]  Final   SPECIMEN QUALITY: Adequate  Final   Source BLOOD 1  Final   STATUS: FINAL  Final   Result: No growth after 5 days  Final   COMMENT: Aerobic and anaerobic bottle received.  Final  Blood culture (routine single)     Status: None   Collection Time: 02/06/24 11:55 AM   Specimen: Blood  Result Value Ref Range Status   MICRO NUMBER: [id number]  Final   SPECIMEN QUALITY: Suboptimal  Final   Source BLOOD 2  Final   STATUS: FINAL  Final   Result:   Final    No growth after 5 days Inspection of blood culture bottles indicates that an inadequate volume of blood may have been collected for the detection of sepsis.   COMMENT: Aerobic and anaerobic bottle received.  Final   Imaging  No results found.    Assessment/Plan # RT knee septic arthritis, Native  - in the setting of steroid injection for OA - OP arthrocentesis done which grew MSSA.  - Underwent rt knee arthroscopy. OR cx MSSA.  - Discharged to complete 3 weeks of IV cefazolin  through 12/3 to be followed by 1 week of PO antibiotics.  - still has persistent pain, swelling, warmth  - Underwent rt knee arthroscopic debridement, synovectomy on 11/26. No cx sent. Per OR note, no significant effusion.  Significant hyperemic synovium noted throughout the joint.  - 12/8 Dr Dennise visit, continued on IV cefazolin   -  clinically improving   Plan  - DC PICC line and IV cefazolin  - Start linezolid 600 mg p.o. twice daily through 03/24/24.  I told her to hold fluoxetine  while taking linezolid.  She is taking fluoxetine  for depression and  reports should not have an issue holding.  - fu in 3 weeks  - fu with orthopedics as instructed  # Medication monitoring  - 12/24 reviewed and discussed. ESR 16, CRP 4, CBC and CMP unremarkable  # Altered taste sensation - Could be due to antibiotic should resolve gradually after stopping IV cefazolin   I personally spent a total of 30 minutes in the care of the patient today including preparing to see the patient, getting/reviewing separately obtained history, performing a medically appropriate exam/evaluation, counseling and educating, placing orders, documenting clinical information in the EHR, independently interpreting results, and communicating results.   Of note, portions of this note may have been created with voice recognition software. While this note has been edited for accuracy, occasional wrong-word or sound-a-like substitutions may have occurred due to the inherent limitations of voice recognition software.   Annalee Joseph, MD [facility] 03/15/2024, 11:16 AM    "

## 2024-04-06 ENCOUNTER — Ambulatory Visit: Admitting: Infectious Diseases

## 2024-04-06 ENCOUNTER — Other Ambulatory Visit: Payer: Self-pay

## 2024-04-06 ENCOUNTER — Encounter: Payer: Self-pay | Admitting: Infectious Diseases

## 2024-04-06 VITALS — BP 130/75 | HR 83 | Temp 98.3°F | Ht 67.0 in | Wt 148.0 lb

## 2024-04-06 DIAGNOSIS — Z5181 Encounter for therapeutic drug level monitoring: Secondary | ICD-10-CM | POA: Diagnosis not present

## 2024-04-06 DIAGNOSIS — R197 Diarrhea, unspecified: Secondary | ICD-10-CM

## 2024-04-06 DIAGNOSIS — M00061 Staphylococcal arthritis, right knee: Secondary | ICD-10-CM

## 2024-04-06 NOTE — Progress Notes (Signed)
 "  Reason for Fu: septic arthritis   Patient Active Problem List   Diagnosis Date Noted   Altered taste 03/15/2024   Peripherally inserted central catheter (PICC) in place 03/02/2024   Medication monitoring encounter 02/06/2024   PICC (peripherally inserted central catheter) in place 02/06/2024   MSSA (methicillin susceptible Staphylococcus aureus) infection 02/06/2024   Septic arthritis (HCC) 01/28/2024   Leukocytosis 01/28/2024   Hyponatremia 01/28/2024   Genetic testing 03/19/2022   At high risk for breast cancer 03/19/2022   Family history of breast cancer 02/28/2022   Family history of prostate cancer in father 02/28/2022   Allergic rhinitis 09/24/2021   Decreased estrogen level 09/24/2021   Diarrhea 09/24/2021   Hemorrhoids without complication 09/24/2021   Irritable bowel syndrome 09/24/2021   Knee pain 09/24/2021   Perimenopausal 09/24/2021   History of colonic polyps 09/24/2021   Pure hypercholesterolemia 09/24/2021   Skin sensation disturbance 09/24/2021   Osteoarthritis of left knee 08/29/2017   Anxiety and depression 04/22/2017   Sleep disorder, circadian, 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 Patient had OP arthrocentesis done which grew MSSA. Underwent rt knee arthroscopy. OR cx MSSA. Discharged to complete 3 weeks of IV cefazolin  through 12/3 to be followed by 1 week of PO antibiotics.   11/21 Accompanied by family member. Receiving IV antibiotics through PICC line with no concerns related to PICC or antibiotics except some loose stool. Saw Orthopedics  yesterday for worsening rt knee pain and swelling, they had fluid aspirated and sent for further testing and have a fu on Tuesday. She was also started on percocet for pain. No fevers but night sweats last few days. She is walking with walker.   Underwent rt knee arthroscopic debridement, synovectomy on 11/26. No cx sent. Per OR note, no significant effusion.  Significant hyperemic synovium noted throughout the joint.   Seen by orthopedics on 12/2.  Seen by Dr. Dennise on 12/8 and continued on IV cefazolin .  12/16 Accompanied by her husband. Reports continuing IV cefazolin  through PICC without any concerns or missed doses without any concerns related to PICC or antibiotics. She is doing PT. Still some swelling and tender in rt knee but ambulatory.  Denies fever, chills.  Denies nausea, vomiting or diarrhea.   12/29 Accompanied by husband.  Getting IV cefazolin  through PICC.  Some concerns related to abnormal taste sensation.  She had a reaction after eating lobster in christmas evening when she had nausea, vomiting which has resolved. Mild pain at the PICC insertion site.  She is following with orthopedics and getting PT. Rt knee feels ok.  She almost fell on her back this morning and she missed a step but no injuries.  Denies fever, chills.  Denies N/V/D.   1/20  Completed PO linezolid  on 1/8.  Reports having subjective fever on Thursday followed by diarrhea on Friday.  She reports eating bagel, white rice chicken Thursday/Friday, nothing unusual. No recent sick contacts or travel or family members with diarrhea.  Reports she has very minimal yellow stool when she goes to bathroom, gas+, and mostly gets watery, frequency is variable from 3-8.  Denies fevers, chills but has abdominal cramping.  Initially had nausea, vomiting which has resolved.  She feels her taste is still altered.  Noted blackish discoloration of tongue after dinner with past yesterday night which she scraped off.  Denies difficulty  or pain during swallowing.  No concerns with Rt knee. She is doing PT twice a week and is ambulatory.  She feels her right knee is close to left knee. No other concerns.   Review of Systems: all systems reviewed with pertinent positives and negatives as listed above   Past Medical History:  Diagnosis Date   Anxiety    Arthritis    knee   Depression    Eczema    GERD (gastroesophageal reflux disease)    H/O seasonal allergies    Past Surgical History:  Procedure Laterality Date   BREAST EXCISIONAL BIOPSY Left 1996   benign   CHOLECYSTECTOMY N/A 05/09/2023   Procedure: LAPAROSCOPIC CHOLECYSTECTOMY;  Surgeon: Vernetta Berg, MD;  Location: [facility];  Service: General;  Laterality: N/A;   COLONOSCOPY     EYE SURGERY     strabismus   KNEE ARTHROSCOPY Right 01/28/2024   Procedure: ARTHROSCOPY, KNEE;  Surgeon: Reyne Cordella SQUIBB, MD;  Location: [facility] OR;  Service: Orthopedics;  Laterality: Right;   REFRACTIVE SURGERY     SYNOVECTOMY Right 02/11/2024   Procedure: ARTHROSCOPIC DEBRIDEMENT RIGHT KNEE, SYNOVECTOMY;  Surgeon: Reyne Cordella SQUIBB, MD;  Location: [facility] ORS;  Service: Orthopedics;  Laterality: Right;    Social History   Tobacco Use   Smoking status: Never   Smokeless tobacco: Never  Vaping Use   Vaping status: Never Used  Substance Use Topics   Alcohol use: Yes    Alcohol/week: 7.0 standard drinks of alcohol    Types: 7 Glasses of wine per week   Drug use: No    Family History  Problem Relation Age of Onset   Arthritis Mother    Prostate cancer Father 46       metastatic   Drug abuse Father    Hypertension Father    Breast cancer Sister 27   Melanoma Brother     Allergies  Allergen Reactions   Codeine Camsylate [Codeine] Nausea And Vomiting    Health Maintenance  Topic Date Due   Zoster Vaccines- Shingrix (2 of 2) 06/22/2019   Pneumococcal Vaccine: 50+ Years (2 of 2 - PCV) 01/21/2020   Mammogram  12/07/2025   Cervical Cancer Screening (HPV/Pap Cotest)   12/19/2026   Colonoscopy  03/16/2029   DTaP/Tdap/Td (2 - Td or Tdap) 06/22/2031   Influenza Vaccine  Completed   HPV VACCINES (No Doses Required) Completed   COVID-19 Vaccine  Completed   Hepatitis C Screening  Completed   HIV Screening  Completed   Hepatitis B Vaccines 19-59 Average Risk  Aged Out   Meningococcal B Vaccine  Aged Out    Objective: BP 130/75   Pulse 83   Temp 98.3 F (36.8 C) (Oral)   Ht 5' 7 (1.702 m)   Wt 148 lb (67.1 kg)   SpO2 98%   BMI 23.18 kg/m    Physical Exam Constitutional:      Appearance: Normal appearance.  HENT:     Head: Normocephalic and atraumatic.      Mouth: Mucous membranes are moist. No thrush, coating, blackish appearance  Eyes:    Conjunctiva/sclera: Conjunctivae normal.     Pupils: Pupils are equal, round, and b/l symmetrical    Cardiovascular:     Rate  and Rhythm: Normal rate     Heart sounds:  Pulmonary:     Effort: Pulmonary effort is normal.     Breath sounds:  Abdominal:     General: Non distended     Palpations: soft, non tender   Musculoskeletal:        General: ambulatory without support. Rt knee with mild swelling as expected in post op knee, no other signs of septic arthritis.   Skin:    General: Skin is warm and dry.     Comments: Rt arm PICC removal site looks okay with no signs of infection   Neurological:     General: grossly non focal     Mental Status: awake, alert  Psychiatric:        Mood and Affect: Mood normal.   Lab Results Lab Results  Component Value Date   WBC 10.8 02/06/2024   HGB 11.0 (L) 02/06/2024   HCT 33.2 (L) 02/06/2024   MCV 89.5 02/06/2024   PLT 613 (H) 02/06/2024    Lab Results  Component Value Date   CREATININE 0.53 02/06/2024   BUN 18 02/06/2024   NA 136 02/06/2024   K 4.6 02/06/2024   CL 98 02/06/2024   CO2 28 02/06/2024    Lab Results  Component Value Date   ALT 17 05/05/2023   AST 17 05/05/2023   ALKPHOS 66 05/05/2023   BILITOT 0.6 05/05/2023    No  results found for: CHOL, HDL, LDLCALC, LDLDIRECT, TRIG, CHOLHDL No results found for: LABRPR, RPRTITER No results found for: HIV1RNAQUANT, HIV1RNAVL, CD4TABS   Microbiology Results for orders placed or performed in visit on 02/06/24  Blood culture (routine single)     Status: None   Collection Time: 02/06/24 11:42 AM   Specimen: Blood  Result Value Ref Range Status   MICRO NUMBER: [id number]  Final   SPECIMEN QUALITY: Adequate  Final   Source BLOOD 1  Final   STATUS: FINAL  Final   Result: No growth after 5 days  Final   COMMENT: Aerobic and anaerobic bottle received.  Final  Blood culture (routine single)     Status: None   Collection Time: 02/06/24 11:55 AM   Specimen: Blood  Result Value Ref Range Status   MICRO NUMBER: [id number]  Final   SPECIMEN QUALITY: Suboptimal  Final   Source BLOOD 2  Final   STATUS: FINAL  Final   Result:   Final    No growth after 5 days Inspection of blood culture bottles indicates that an inadequate volume of blood may have been collected for the detection of sepsis.   COMMENT: Aerobic and anaerobic bottle received.  Final   Imaging  No results found.    Assessment/Plan # RT knee septic arthritis, Native  - in the setting of steroid injection for OA - OP arthrocentesis done which grew MSSA.  - Underwent rt knee arthroscopy. OR cx MSSA.  - Discharged to complete 3 weeks of IV cefazolin  through 12/3 to be followed by 1 week of PO antibiotics.  - still has persistent pain, swelling, warmth  - Underwent rt knee arthroscopic debridement, synovectomy on 11/26. No cx sent. Per OR note, no significant effusion.  Significant hyperemic synovium noted throughout the joint.  - 12/8 Dr Dennise visit, continued on IV cefazolin   - IV cefazolin  stopped 12/29> po linezolid  through 1/8 - clinically improved. 12/24 labs with normal ESR and CRP  # Diarrhea - will check C dif and GIP, CBC and  BMP - advised to focus on oral hydration   I  personally spent a total of 30 minutes in the care of the patient today including preparing to see the patient, getting/reviewing separately obtained history, performing a medically appropriate exam/evaluation, counseling and educating, placing orders, documenting clinical information in the EHR, independently interpreting results, and communicating results.    Annalee Joseph, MD [facility] 04/06/2024, 10:26 AM       [1]  Current Outpatient Medications on File Prior to Visit  Medication Sig Dispense Refill   FLUoxetine  (PROZAC ) 40 MG capsule Take 40 mg by mouth in the morning.     No current facility-administered medications on file prior to visit.   "

## 2024-04-07 ENCOUNTER — Other Ambulatory Visit: Payer: Self-pay

## 2024-04-07 ENCOUNTER — Other Ambulatory Visit

## 2024-04-07 ENCOUNTER — Telehealth: Payer: Self-pay

## 2024-04-07 DIAGNOSIS — Z5181 Encounter for therapeutic drug level monitoring: Secondary | ICD-10-CM

## 2024-04-07 DIAGNOSIS — M00061 Staphylococcal arthritis, right knee: Secondary | ICD-10-CM

## 2024-04-07 DIAGNOSIS — R197 Diarrhea, unspecified: Secondary | ICD-10-CM

## 2024-04-07 LAB — CBC
HCT: 33.3 % — ABNORMAL LOW (ref 35.9–46.0)
Hemoglobin: 10.7 g/dL — ABNORMAL LOW (ref 11.7–15.5)
MCH: 28.3 pg (ref 27.0–33.0)
MCHC: 32.1 g/dL (ref 31.6–35.4)
MCV: 88.1 fL (ref 81.4–101.7)
MPV: 10.4 fL (ref 7.5–12.5)
Platelets: 323 Thousand/uL (ref 140–400)
RBC: 3.78 Million/uL — ABNORMAL LOW (ref 3.80–5.10)
RDW: 13.2 % (ref 11.0–15.0)
WBC: 6 Thousand/uL (ref 3.8–10.8)

## 2024-04-07 LAB — BASIC METABOLIC PANEL WITH GFR
BUN: 11 mg/dL (ref 7–25)
CO2: 29 mmol/L (ref 20–32)
Calcium: 9.3 mg/dL (ref 8.6–10.4)
Chloride: 104 mmol/L (ref 98–110)
Creat: 0.61 mg/dL (ref 0.50–1.05)
Glucose, Bld: 126 mg/dL — ABNORMAL HIGH (ref 65–99)
Potassium: 3.7 mmol/L (ref 3.5–5.3)
Sodium: 140 mmol/L (ref 135–146)
eGFR: 100 mL/min/1.73m2

## 2024-04-07 NOTE — Telephone Encounter (Signed)
 Patient walked into office to drop off stool sample, asked to speak with someone regarding questions. Spoke with pt who asked what she can take for abdominal cramps. States cramps are worst at night. Spoke with Dr. Dea who advised pt to avoid/limit imodium until C diff is ruled out. Instructed pt to increase water intake and try drinks with electrolytes. Understand she can have few doses of imodium (1-2) for severe cramps. Also understands to contact PCP for alternative options.  Pt will be in [place] for trip. Asked that mychart message or detailed voicemail be left regarding results. Pt also understands that lab will submit current stool sample- provided a second stool kit incase she needs to recollect. [facility]irmed with [facility] team that c diff test would be accepted(0.1 ml) . Will wait to see on GI panel due to not meeting red line on specimen container. Pt aware of plan.  Lorenda CHRISTELLA Code, RMA

## 2024-04-07 NOTE — Addendum Note (Signed)
 Addended by: GRETEL TULLY HERO on: 04/07/2024 09:29 AM   Modules accepted: Orders

## 2024-04-08 ENCOUNTER — Ambulatory Visit: Payer: Self-pay | Admitting: Infectious Diseases

## 2024-04-08 DIAGNOSIS — A0472 Enterocolitis due to Clostridium difficile, not specified as recurrent: Secondary | ICD-10-CM

## 2024-04-08 MED ORDER — VANCOMYCIN HCL 125 MG PO CAPS: 125.0000 mg | ORAL_CAPSULE | Freq: Four times a day (QID) | ORAL | 0 refills | Status: AC

## 2024-04-08 NOTE — Telephone Encounter (Signed)
 Attempted to call patient to review results. Not able to reach her at this time. During sample drop off yesterday pt mentioned she would be traveling to [place] today. Will send mychart message.  Lorenda CHRISTELLA Code, RMA

## 2024-04-09 LAB — GASTROINTESTINAL PATHOGEN PNL
CampyloBacter Group: NOT DETECTED
Norovirus GI/GII: NOT DETECTED
Rotavirus A: NOT DETECTED
Salmonella species: NOT DETECTED
Shiga Toxin 1: NOT DETECTED
Shiga Toxin 2: NOT DETECTED
Shigella Species: NOT DETECTED
Vibrio Group: NOT DETECTED
Yersinia enterocolitica: NOT DETECTED

## 2024-04-09 LAB — C. DIFFICILE GDH AND TOXIN A/B
GDH ANTIGEN: DETECTED
MICRO NUMBER:: 17501096
SPECIMEN QUALITY:: ADEQUATE
TOXIN A AND B: DETECTED
